# Patient Record
Sex: Male | Born: 1965 | Race: Black or African American | Hispanic: No | Smoking: Never smoker
Health system: Southern US, Community
[De-identification: ages and names within clinical notes are randomized; demographics above are authoritative.]

## PROBLEM LIST (undated history)

## (undated) DIAGNOSIS — M109 Gout, unspecified: Secondary | ICD-10-CM

## (undated) DIAGNOSIS — I10 Essential (primary) hypertension: Secondary | ICD-10-CM

## (undated) HISTORY — PX: APPENDECTOMY: SHX54

## (undated) HISTORY — PX: KNEE ARTHROSCOPY: SUR90

---

## 2017-01-24 ENCOUNTER — Other Ambulatory Visit: Payer: Self-pay

## 2017-01-24 ENCOUNTER — Encounter (HOSPITAL_COMMUNITY): Payer: Self-pay

## 2017-01-24 ENCOUNTER — Observation Stay (HOSPITAL_COMMUNITY): Payer: 59

## 2017-01-24 ENCOUNTER — Inpatient Hospital Stay (HOSPITAL_COMMUNITY)
Admission: EM | Admit: 2017-01-24 | Discharge: 2017-01-31 | DRG: 871 | Disposition: A | Discharge status: Skilled Nursing Facility | Payer: 59 | Attending: Internal Medicine | Admitting: Internal Medicine

## 2017-01-24 DIAGNOSIS — M109 Gout, unspecified: Secondary | ICD-10-CM | POA: Diagnosis present

## 2017-01-24 DIAGNOSIS — D649 Anemia, unspecified: Secondary | ICD-10-CM | POA: Insufficient documentation

## 2017-01-24 DIAGNOSIS — M10062 Idiopathic gout, left knee: Secondary | ICD-10-CM

## 2017-01-24 DIAGNOSIS — K259 Gastric ulcer, unspecified as acute or chronic, without hemorrhage or perforation: Secondary | ICD-10-CM | POA: Diagnosis present

## 2017-01-24 DIAGNOSIS — R509 Fever, unspecified: Secondary | ICD-10-CM

## 2017-01-24 DIAGNOSIS — I10 Essential (primary) hypertension: Secondary | ICD-10-CM

## 2017-01-24 DIAGNOSIS — Z888 Allergy status to other drugs, medicaments and biological substances status: Secondary | ICD-10-CM

## 2017-01-24 DIAGNOSIS — K922 Gastrointestinal hemorrhage, unspecified: Secondary | ICD-10-CM

## 2017-01-24 DIAGNOSIS — M25539 Pain in unspecified wrist: Secondary | ICD-10-CM

## 2017-01-24 DIAGNOSIS — J181 Lobar pneumonia, unspecified organism: Secondary | ICD-10-CM | POA: Diagnosis present

## 2017-01-24 DIAGNOSIS — E876 Hypokalemia: Secondary | ICD-10-CM | POA: Diagnosis not present

## 2017-01-24 DIAGNOSIS — A419 Sepsis, unspecified organism: Principal | ICD-10-CM | POA: Diagnosis present

## 2017-01-24 DIAGNOSIS — R6520 Severe sepsis without septic shock: Secondary | ICD-10-CM | POA: Diagnosis present

## 2017-01-24 DIAGNOSIS — R0600 Dyspnea, unspecified: Secondary | ICD-10-CM

## 2017-01-24 DIAGNOSIS — K2970 Gastritis, unspecified, without bleeding: Secondary | ICD-10-CM | POA: Diagnosis present

## 2017-01-24 DIAGNOSIS — D539 Nutritional anemia, unspecified: Secondary | ICD-10-CM | POA: Diagnosis present

## 2017-01-24 DIAGNOSIS — M25562 Pain in left knee: Secondary | ICD-10-CM | POA: Diagnosis not present

## 2017-01-24 DIAGNOSIS — N179 Acute kidney failure, unspecified: Secondary | ICD-10-CM

## 2017-01-24 HISTORY — DX: Gout, unspecified: M10.9

## 2017-01-24 HISTORY — DX: Essential (primary) hypertension: I10

## 2017-01-24 LAB — COMPREHENSIVE METABOLIC PANEL
ALBUMIN: 3.4 g/dL — AB (ref 3.5–5.0)
ALK PHOS: 101 U/L (ref 38–126)
ALT: 26 U/L (ref 17–63)
ANION GAP: 18 — AB (ref 5–15)
AST: 49 U/L — ABNORMAL HIGH (ref 15–41)
BUN: 18 mg/dL (ref 6–20)
CALCIUM: 7 mg/dL — AB (ref 8.9–10.3)
CHLORIDE: 89 mmol/L — AB (ref 101–111)
CO2: 30 mmol/L (ref 22–32)
Creatinine, Ser: 1.06 mg/dL (ref 0.61–1.24)
GFR calc Af Amer: >60 mL/min (ref >60)
GFR calc non Af Amer: >60 mL/min (ref >60)
GLUCOSE: 167 mg/dL — AB (ref 65–99)
Potassium: 2.4 mmol/L — CL (ref 3.5–5.1)
SODIUM: 137 mmol/L (ref 135–145)
Total Bilirubin: 2 mg/dL — ABNORMAL HIGH (ref 0.3–1.2)
Total Protein: 7.4 g/dL (ref 6.5–8.1)

## 2017-01-24 LAB — BASIC METABOLIC PANEL
ANION GAP: 15 (ref 5–15)
BUN: 16 mg/dL (ref 6–20)
CHLORIDE: 92 mmol/L — AB (ref 101–111)
CO2: 31 mmol/L (ref 22–32)
Calcium: 6.7 mg/dL — ABNORMAL LOW (ref 8.9–10.3)
Creatinine, Ser: 0.92 mg/dL (ref 0.61–1.24)
GFR calc non Af Amer: >60 mL/min (ref >60)
Glucose, Bld: 153 mg/dL — ABNORMAL HIGH (ref 65–99)
Potassium: 3 mmol/L — ABNORMAL LOW (ref 3.5–5.1)
Sodium: 138 mmol/L (ref 135–145)

## 2017-01-24 LAB — SYNOVIAL CELL COUNT + DIFF, W/ CRYSTALS
MONOCYTE-MACROPHAGE-SYNOVIAL FLUID: 3 % — AB (ref 50–90)
NEUTROPHIL, SYNOVIAL: 97 % — AB (ref 0–25)
WBC, Synovial: 61500 /mm3 — ABNORMAL HIGH (ref 0–200)

## 2017-01-24 LAB — CBC WITH DIFFERENTIAL/PLATELET
BASOS PCT: 0 %
Basophils Absolute: 0 10*3/uL (ref 0.0–0.1)
EOS ABS: 0 10*3/uL (ref 0.0–0.7)
EOS PCT: 0 %
HEMATOCRIT: 24.9 % — AB (ref 39.0–52.0)
HEMOGLOBIN: 8.6 g/dL — AB (ref 13.0–17.0)
LYMPHS PCT: 8 %
Lymphs Abs: 1.4 10*3/uL (ref 0.7–4.0)
MCH: 36 pg — AB (ref 26.0–34.0)
MCHC: 34.5 g/dL (ref 30.0–36.0)
MCV: 104.2 fL — AB (ref 78.0–100.0)
Monocytes Absolute: 1.9 10*3/uL — ABNORMAL HIGH (ref 0.1–1.0)
Monocytes Relative: 11 %
NEUTROS ABS: 14.1 10*3/uL — AB (ref 1.7–7.7)
NEUTROS PCT: 81 %
Platelets: 284 10*3/uL (ref 150–400)
RBC: 2.39 MIL/uL — ABNORMAL LOW (ref 4.22–5.81)
RDW: 21.6 % — ABNORMAL HIGH (ref 11.5–15.5)
WBC: 17.4 10*3/uL — ABNORMAL HIGH (ref 4.0–10.5)

## 2017-01-24 LAB — SAMPLE TO BLOOD BANK

## 2017-01-24 LAB — URIC ACID: Uric Acid, Serum: 8.7 mg/dL — ABNORMAL HIGH (ref 4.4–7.6)

## 2017-01-24 LAB — MAGNESIUM
MAGNESIUM: 0.7 mg/dL — AB (ref 1.7–2.4)
Magnesium: 0.8 mg/dL — CL (ref 1.7–2.4)

## 2017-01-24 LAB — SEDIMENTATION RATE: SED RATE: 84 mm/h — AB (ref 0–16)

## 2017-01-24 LAB — CBG MONITORING, ED: GLUCOSE-CAPILLARY: 134 mg/dL — AB (ref 65–99)

## 2017-01-24 MED ORDER — INDOMETHACIN 25 MG PO CAPS
50.0000 mg | ORAL_CAPSULE | Freq: Three times a day (TID) | ORAL | Status: DC
  Filled 2017-01-24: qty 2

## 2017-01-24 MED ORDER — MAGNESIUM SULFATE 2 GM/50ML IV SOLN
2.0000 g | Freq: Once | INTRAVENOUS | Status: AC
  Administered 2017-01-24: 2 g via INTRAVENOUS
  Filled 2017-01-24: qty 50

## 2017-01-24 MED ORDER — COLCHICINE 0.6 MG PO TABS: 0.6000 mg | ORAL_TABLET | Freq: Once | ORAL | Status: DC

## 2017-01-24 MED ORDER — COLCHICINE 0.6 MG PO TABS
1.2000 mg | ORAL_TABLET | Freq: Once | ORAL | Status: AC
  Administered 2017-01-24: 1.2 mg via ORAL

## 2017-01-24 MED ORDER — ONDANSETRON HCL 4 MG/2ML IJ SOLN
4.0000 mg | Freq: Once | INTRAMUSCULAR | Status: AC
  Administered 2017-01-24: 4 mg via INTRAVENOUS
  Filled 2017-01-24: qty 2

## 2017-01-24 MED ORDER — PANTOPRAZOLE SODIUM 40 MG PO TBEC
40.0000 mg | DELAYED_RELEASE_TABLET | Freq: Every day | ORAL | Status: DC
  Administered 2017-01-25 – 2017-01-31 (×7): 40 mg via ORAL
  Filled 2017-01-24 (×7): qty 1

## 2017-01-24 MED ORDER — ACETAMINOPHEN 325 MG PO TABS
650.0000 mg | ORAL_TABLET | Freq: Three times a day (TID) | ORAL | Status: DC | PRN
  Administered 2017-01-24 – 2017-01-25 (×3): 650 mg via ORAL
  Filled 2017-01-24 (×3): qty 2

## 2017-01-24 MED ORDER — FENTANYL CITRATE (PF) 100 MCG/2ML IJ SOLN
100.0000 ug | INTRAMUSCULAR | Status: DC | PRN
  Administered 2017-01-24 (×3): 100 ug via INTRAVENOUS
  Filled 2017-01-24 (×3): qty 2

## 2017-01-24 MED ORDER — ALLOPURINOL 100 MG PO TABS
100.0000 mg | ORAL_TABLET | Freq: Every day | ORAL | Status: DC
  Administered 2017-01-25 – 2017-01-26 (×2): 100 mg via ORAL
  Filled 2017-01-24 (×2): qty 1

## 2017-01-24 MED ORDER — POTASSIUM CHLORIDE IN NACL 40-0.9 MEQ/L-% IV SOLN
INTRAVENOUS | Status: AC
  Administered 2017-01-24: 75 mL/h via INTRAVENOUS
  Filled 2017-01-24: qty 1000

## 2017-01-24 MED ORDER — COLCHICINE 0.6 MG PO TABS
0.6000 mg | ORAL_TABLET | Freq: Every day | ORAL | Status: DC
  Administered 2017-01-25 – 2017-01-26 (×2): 0.6 mg via ORAL
  Filled 2017-01-24 (×2): qty 1

## 2017-01-24 MED ORDER — LEVOFLOXACIN IN D5W 750 MG/150ML IV SOLN
750.0000 mg | INTRAVENOUS | Status: DC
  Administered 2017-01-24: 750 mg via INTRAVENOUS
  Filled 2017-01-24: qty 150

## 2017-01-24 MED ORDER — SODIUM CHLORIDE 0.9 % IV BOLUS (SEPSIS)
500.0000 mL | Freq: Once | INTRAVENOUS | Status: AC
Start: 2017-01-24 — End: 2017-01-24
  Administered 2017-01-24: 500 mL via INTRAVENOUS

## 2017-01-24 MED ORDER — POTASSIUM CHLORIDE 10 MEQ/100ML IV SOLN
10.0000 meq | INTRAVENOUS | Status: AC
Start: 2017-01-24 — End: 2017-01-24
  Administered 2017-01-24 (×3): 10 meq via INTRAVENOUS
  Filled 2017-01-24 (×3): qty 100

## 2017-01-24 MED ORDER — COLCHICINE 0.6 MG PO TABS
0.6000 mg | ORAL_TABLET | Freq: Once | ORAL | Status: DC
  Filled 2017-01-24: qty 1

## 2017-01-24 MED ORDER — HYDRALAZINE HCL 50 MG PO TABS
50.0000 mg | ORAL_TABLET | Freq: Three times a day (TID) | ORAL | Status: DC
  Administered 2017-01-24 – 2017-01-29 (×15): 50 mg via ORAL
  Filled 2017-01-24 (×15): qty 1

## 2017-01-24 MED ORDER — AMLODIPINE BESYLATE 10 MG PO TABS
10.0000 mg | ORAL_TABLET | Freq: Every day | ORAL | Status: DC
  Administered 2017-01-25 – 2017-01-31 (×7): 10 mg via ORAL
  Filled 2017-01-24 (×7): qty 1

## 2017-01-24 MED ORDER — OXYCODONE HCL 5 MG PO TABS
5.0000 mg | ORAL_TABLET | ORAL | Status: DC | PRN
  Administered 2017-01-25 (×3): 5 mg via ORAL
  Filled 2017-01-24 (×3): qty 1

## 2017-01-24 MED ORDER — FENTANYL CITRATE (PF) 100 MCG/2ML IJ SOLN
100.0000 ug | INTRAMUSCULAR | Status: DC | PRN
  Administered 2017-01-24 – 2017-01-25 (×6): 100 ug via INTRAVENOUS
  Filled 2017-01-24 (×6): qty 2

## 2017-01-24 MED ORDER — POTASSIUM CHLORIDE CRYS ER 20 MEQ PO TBCR
40.0000 meq | EXTENDED_RELEASE_TABLET | Freq: Once | ORAL | Status: AC
  Administered 2017-01-24: 40 meq via ORAL
  Filled 2017-01-24: qty 2

## 2017-01-24 NOTE — ED Notes (Signed)
Bed: XB14WA25 Expected date:  Expected time:  Means of arrival:  Comments: EMS-gout flare up

## 2017-01-24 NOTE — ED Notes (Signed)
ED Provider at bedside. 

## 2017-01-24 NOTE — Progress Notes (Signed)
Pharmacy Antibiotic Note  Scott [name] is a 51 y.o. male admitted on 01/24/2017 with fever, possible PNA.  Pharmacy has been consulted for levaquin dosing.  WBC 17.4, Cr 0.94, Temp 100.3. Wt 101.2 kg. CXR: LL PNA   Plan: levaquin 750 mg IV q24 - change to PO when able Pharmacy to sign off  Height: 5\' 11"  (180.3 cm) Weight: 223 lb (101.2 kg) IBW/kg (Calculated) : 75.3  Temp (24hrs), Avg:100.1 F (37.8 C), Min:99.2 F (37.3 C), Max:101.3 F (38.5 C)  Recent Labs  Lab 01/24/17 0855 01/24/17 1425  WBC 17.4*  --   CREATININE 1.06 0.92    Estimated Creatinine Clearance: 117.7 mL/min (by C-G formula based on SCr of 0.92 mg/dL).    Allergies  Allergen Reactions  . Ace Inhibitors Swelling    Facial swelling    Thank you for allowing pharmacy to be a part of this patient's care. Herby AbrahamMichelle T. Gevorg Brum, Pharm.D. [phone] 01/24/2017 5:04 PM

## 2017-01-24 NOTE — ED Provider Notes (Signed)
[facility] Provider Note   CSN: [id number] Arrival date & time: 01/24/17  0803     History   Chief Complaint Chief Complaint  Patient presents with  . Gout    HPI Scott [name] is a 51 y.o. male.  He complains of pain in his legs and right wrist, beginning 2 days ago without trauma.  Also had similar pain last week, at which time he was hospitalized, for upper abdominal pain, and apparently received blood transfusions.  No prior history of blood transfusion.  He was told that his "stomach was inflamed,", and that it was related to his gout medication.  No recent fever, chills, vomiting, weakness or dizziness.  He denies headache or back pain.  He has a sensation of tingling in both feet, which is been going on for about 2 weeks. He was started on Allopurinol 2 days ago.  was started there are no other known modifying factors.  HPI  Past Medical History:  Diagnosis Date  . Gout   . Hypertension     Patient Active Problem List   Diagnosis Date Noted  . Hypokalemia 01/24/2017  . Gout attack 01/24/2017  . Gastritis 01/24/2017  . h/o recent GIB (gastrointestinal bleeding) 01/24/2017  . Hypomagnesemia 01/24/2017  . Anemia   . Hypertension     History reviewed. No pertinent surgical history.     Home Medications    Prior to Admission medications   Medication Sig Start Date End Date Taking? Authorizing Provider  acetaminophen (TYLENOL) 500 MG tablet Take 500 mg by mouth every 6 (six) hours as needed for mild pain.   Yes [provider]  allopurinol (ZYLOPRIM) 100 MG tablet Take 100 mg by mouth daily.   Yes [provider]  amLODipine (NORVASC) 10 MG tablet Take 10 mg by mouth daily.   Yes [provider]  furosemide (LASIX) 20 MG tablet Take 20 mg by mouth daily as needed for edema.   Yes [provider]  hydrALAZINE (APRESOLINE) 50 MG tablet Take 50 mg by mouth 3 (three) times daily.   Yes  [provider]  indomethacin (INDOCIN) 50 MG capsule Take 50 mg by mouth 3 (three) times daily with meals.   Yes [provider]  Multiple Vitamin (MULTIVITAMIN WITH MINERALS) TABS tablet Take 1 tablet by mouth daily.   Yes [provider]  omeprazole (PRILOSEC) 20 MG capsule Take 20 mg by mouth 2 (two) times daily before a meal.   Yes [provider]  predniSONE (DELTASONE) 10 MG tablet Take 30 mg by mouth daily with breakfast.   Yes [provider]    Family History History reviewed. No pertinent family history.  Social History Social History   Tobacco Use  . Smoking status: Never Smoker  . Smokeless tobacco: Never Used  Substance Use Topics  . Alcohol use: No    Frequency: Never  . Drug use: No     Allergies   Ace inhibitors   Review of Systems Review of Systems  All other systems reviewed and are negative.    Physical Exam Updated Vital Signs BP (!) 152/91 (BP Location: Left Arm)   Pulse (!) 115   Temp 100.3 F (37.9 C) (Oral)   Resp 18   Ht 5\' 11"  (1.803 m)   Wt 101.2 kg (223 lb)   SpO2 99%   BMI 31.10 kg/m   Physical Exam  Constitutional: He is oriented to person, place, and time. He appears  well-developed and well-nourished. No distress.  HENT:  Head: Normocephalic and atraumatic.  Right Ear: External ear normal.  Left Ear: External ear normal.  Eyes: Conjunctivae and EOM are normal. Pupils are equal, round, and reactive to light.  Neck: Normal range of motion and phonation normal. Neck supple.  Cardiovascular: Normal rate, regular rhythm and normal heart sounds.  Pulmonary/Chest: Effort normal and breath sounds normal. He exhibits no bony tenderness.  Abdominal: Soft. There is no tenderness.  Musculoskeletal: Normal range of motion.  Large left knee joint effusion.  Moderate swelling left ankle.  Mild swelling right wrist, radiocarpal region. Bilateral LE edema L > R.  Neurological: He is alert and  oriented to person, place, and time. No cranial nerve deficit or sensory deficit. He exhibits normal muscle tone. Coordination normal.  Skin: Skin is warm, dry and intact.  Psychiatric: He has a normal mood and affect. His behavior is normal. Judgment and thought content normal.  Nursing note and vitals reviewed.    ED Treatments / Results  Labs (all labs ordered are listed, but only abnormal results are displayed) Labs Reviewed  COMPREHENSIVE METABOLIC PANEL - Abnormal; Notable for the following components:      Result Value   Potassium 2.4 (*)    Chloride 89 (*)    Glucose, Bld 167 (*)    Calcium 7.0 (*)    Albumin 3.4 (*)    AST 49 (*)    Total Bilirubin 2.0 (*)    Anion gap 18 (*)    All other components within normal limits  CBC WITH DIFFERENTIAL/PLATELET - Abnormal; Notable for the following components:   WBC 17.4 (*)    RBC 2.39 (*)    Hemoglobin 8.6 (*)    HCT 24.9 (*)    MCV 104.2 (*)    MCH 36.0 (*)    RDW 21.6 (*)    Neutro Abs 14.1 (*)    Monocytes Absolute 1.9 (*)    All other components within normal limits  URIC ACID - Abnormal; Notable for the following components:   Uric Acid, Serum 8.7 (*)    All other components within normal limits  SEDIMENTATION RATE - Abnormal; Notable for the following components:   Sed Rate 84 (*)    All other components within normal limits  MAGNESIUM - Abnormal; Notable for the following components:   Magnesium 0.7 (*)    All other components within normal limits  SYNOVIAL CELL COUNT + DIFF, W/ CRYSTALS - Abnormal; Notable for the following components:   Appearance-Synovial TURBID (*)    WBC, Synovial 61,500 (*)    Neutrophil, Synovial 97 (*)    Monocyte-Macrophage-Synovial Fluid 3 (*)    All other components within normal limits  BASIC METABOLIC PANEL - Abnormal; Notable for the following components:   Potassium 3.0 (*)    Chloride 92 (*)    Glucose, Bld 153 (*)    Calcium 6.7 (*)    All other components within normal  limits  MAGNESIUM - Abnormal; Notable for the following components:   Magnesium 0.8 (*)    All other components within normal limits  CBG MONITORING, ED - Abnormal; Notable for the following components:   Glucose-Capillary 134 (*)    All other components within normal limits  BODY FLUID CULTURE  CULTURE, BLOOD (ROUTINE X 2)  CULTURE, BLOOD (ROUTINE X 2)  GLUCOSE, BODY FLUID OTHER  URINALYSIS, ROUTINE W REFLEX MICROSCOPIC  BASIC METABOLIC PANEL  CBC  SAMPLE TO BLOOD BANK  EKG  EKG Interpretation None       Radiology Dg Chest 2 View  Result Date: 01/24/2017 CLINICAL DATA:  High fever. EXAM: CHEST  2 VIEW COMPARISON:  None. FINDINGS: Heart size is normal. Mediastinal shadows are normal. Abnormal density on the lateral view overlying the lower spine is consistent with lower lobe pneumonia. Side indeterminate based on the frontal view. No effusions. No acute bone finding. IMPRESSION: Lower lobe pneumonia visible on the lateral view, side indeterminate. Electronically Signed   By: Paulina Fusi M.D.   On: 01/24/2017 15:54    Procedures .Joint Aspiration/Arthrocentesis Date/Time: 01/24/2017 12:07 PM Performed by: Mancel Bale, MD Authorized by: Mancel Bale, MD   Consent:    Consent obtained:  Verbal   Consent given by:  Patient   Risks discussed:  Bleeding, infection and incomplete drainage   Alternatives discussed:  No treatment Location:    Location:  Knee   Knee:  L knee Anesthesia (see MAR for exact dosages):    Anesthesia method:  None Procedure details:    Preparation: Patient was prepped and draped in usual sterile fashion     Needle gauge:  18 G   Ultrasound guidance: no     Approach:  Medial   Aspirate characteristics:  Yellow (opaque, thick)   Steroid injected: no     Specimen collected: yes   Post-procedure details:    Dressing:  Adhesive bandage   Patient tolerance of procedure:  Tolerated well, no immediate complications      (including critical care time)  Medications Ordered in ED Medications  amLODipine (NORVASC) tablet 10 mg (not administered)  pantoprazole (PROTONIX) EC tablet 40 mg (not administered)  hydrALAZINE (APRESOLINE) tablet 50 mg (50 mg Oral Given 01/24/17 1621)  allopurinol (ZYLOPRIM) tablet 100 mg (not administered)  0.9 % NaCl with KCl 40 mEq / L  infusion (75 mL/hr Intravenous New Bag/Given 01/24/17 1407)  colchicine tablet 1.2 mg (1.2 mg Oral Given 01/24/17 1621)    Followed by  colchicine tablet 0.6 mg (not administered)    Followed by  colchicine tablet 0.6 mg (not administered)  fentaNYL (SUBLIMAZE) injection 100 mcg (100 mcg Intravenous Given 01/24/17 1438)  acetaminophen (TYLENOL) tablet 650 mg (650 mg Oral Given 01/24/17 1515)  levofloxacin (LEVAQUIN) IVPB 750 mg (750 mg Intravenous New Bag/Given 01/24/17 1746)  ondansetron (ZOFRAN) injection 4 mg (4 mg Intravenous Given 01/24/17 0852)  sodium chloride 0.9 % bolus 500 mL (0 mLs Intravenous Stopped 01/24/17 0943)  potassium chloride SA (K-DUR,KLOR-CON) CR tablet 40 mEq (40 mEq Oral Given 01/24/17 1052)  potassium chloride 10 mEq in 100 mL IVPB (10 mEq Intravenous New Bag/Given 01/24/17 1245)  magnesium sulfate IVPB 2 g 50 mL (0 g Intravenous Stopped 01/24/17 1801)     Initial Impression / Assessment and Plan / ED Course  I have reviewed the triage vital signs and the nursing notes.  Pertinent labs & imaging results that were available during my care of the patient were reviewed by me and considered in my medical decision making (see chart for details).  Clinical Course as of Jan 24 1837  Wed Jan 24, 2017  1049 Record review: He saw his primary care doctor 2 days ago to follow-up on hospitalization, was discharged with hemoglobin 9.6, had Doppler imaging legs to evaluate for DVT.  During the hospitalization he was started on Lasix for peripheral edema, without potassium supplementation.  Patient had long-standing history of alcohol  abuse, leading to suspected gastritis, which cause bleeding.  He  was recently started on allopurinol, to treat gout.  [EW]  1050 Elevated, consistent with gout Uric Acid, Serum: (!) 8.7 [EW]  1050 Low following initiation of diuretic treatment Potassium: (!!) 2.4 [EW]  1050 Low Chloride: (!) 89 [EW]  1050 High Glucose: (!) 167 [EW]  1050 High, nonspecific WBC: (!) 17.4 [EW]  1050 Low, decreased from recent baseline Hemoglobin: (!) 8.6 [EW]  1051 Elevated MCV: (!) 104.2 [EW]  1051 High Pulse Rate: (!) 106 [EW]  1051 High BP: (!) 157/101 [EW]  1110 Elevated Sed Rate: (!) 84 [EW]    Clinical Course User Index [EW] Mancel BaleWentz, Emmabelle Fear, MD     Patient Vitals for the past 24 hrs:  BP Temp Temp src Pulse Resp SpO2 Height Weight  01/24/17 1621 (!) 152/91 100.3 F (37.9 C) Oral (!) 115 18 99 % - -  01/24/17 1321 (!) 148/99 (!) 101.3 F (38.5 C) Oral (!) 112 18 99 % 5\' 11"  (1.803 m) 101.2 kg (223 lb)  01/24/17 1247 (!) 156/99 99.2 F (37.3 C) Oral (!) 113 16 97 % - -  01/24/17 1141 (!) 156/101 - - (!) 112 18 98 % - -  01/24/17 1100 - - - - 18 - - -  01/24/17 1030 (!) 155/99 - - (!) 109 18 96 % - -  01/24/17 0945 (!) 157/101 - - (!) 106 20 97 % - -  01/24/17 0810 (!) 166/105 99.4 F (37.4 C) Oral (!) 111 18 98 % - -  01/24/17 0805 - - - - - 99 % - -    11:08 AM Reevaluation with update and discussion. After initial assessment and treatment, an updated evaluation reveals he is more comfortable at this time.  Findings discussed with patient, all questions answered. Mancel BaleElliott Elfego Giammarino   11:09 AM-Consult complete with Hospitalist. Patient case explained and discussed. She prefers knee aspiration prior to admission to evaluate for knee infection, after that, agrees to admit patient for further evaluation and treatment. Call ended at 11:20 PM   Final Clinical Impressions(s) / ED Diagnoses   Final diagnoses:  Hypokalemia  Anemia, unspecified type  Acute idiopathic gout of left knee  Hypertension,  unspecified type   Acute gout left knee.  Incidental anemia, nonspecific.  Hypertension, persistent.  Hypokalemia, nonspecific.  Nursing Notes Reviewed/ Care Coordinated Applicable Imaging Reviewed Interpretation of Laboratory Data incorporated into ED treatment  Plan: Admit   ED Discharge Orders    None       Mancel BaleWentz, Hennie Gosa, MD 01/24/17 Paulo [id number]

## 2017-01-24 NOTE — H&P (Signed)
History and Physical    Scott EvenerKurk [name] ZOX:[id number] DOB: 11/05/1965 DOA: 01/24/2017  PCP: Patient, No Pcp Per  Patient coming from:  home  Chief Complaint:   Knee pain  HPI: Scott [name] is a 51 y.o. male with medical history significant of recent hospitalization at [facility] with gib due to gastritis requiring blood transfusions from nsaid use for gout comes in with left knee and ankle pain c/w his gout.  He was started on allopurinol 2 days ago and not colchicine.  He was recentling started on lasix due to le swelling from when he was anemic.  He denies chf.  His hgb is 8/6 was 9.6 at his discharge.  He denies any fevers. No chills.  No n/v/d.  Weekly etoh use.  Denies any bleeding.  Pt found to have k level of 2.4 and referred for admission for repletion.  Review of Systems: As per HPI otherwise 10 point review of systems negative.   Past Medical History:  Diagnosis Date  . Gout   . Hypertension     pmhx as above in hpi    has no tobacco, alcohol, and drug history on file. regular etoh use.  O/w neg  Allergies  Allergen Reactions  . Ace Inhibitors Swelling    Facial swelling    No family history on file.  No premature CAD  Prior to Admission medications   Medication Sig Start Date End Date Taking? Authorizing Provider  allopurinol (ZYLOPRIM) 100 MG tablet Take 100 mg by mouth daily.   Yes [provider]  amLODipine (NORVASC) 10 MG tablet Take 10 mg by mouth daily.   Yes [provider]  furosemide (LASIX) 20 MG tablet Take 20 mg by mouth daily as needed for edema.   Yes [provider]  hydrALAZINE (APRESOLINE) 50 MG tablet Take 50 mg by mouth 3 (three) times daily.   Yes [provider]  indomethacin (INDOCIN) 50 MG capsule Take 50 mg by mouth 3 (three) times daily with meals.   Yes [provider]  omeprazole (PRILOSEC) 20 MG capsule Take 20 mg by mouth 2 (two) times daily before a meal.   Yes [provider]     Physical Exam: Vitals:   01/24/17 0945 01/24/17 1030 01/24/17 1100 01/24/17 1141  BP: (!) 157/101 (!) 155/99  (!) 156/101  Pulse: (!) 106 (!) 109  (!) 112  Resp: 20 18 18 18   Temp:      TempSrc:      SpO2: 97% 96%  98%      Constitutional: NAD, calm, comfortable Vitals:   01/24/17 0945 01/24/17 1030 01/24/17 1100 01/24/17 1141  BP: (!) 157/101 (!) 155/99  (!) 156/101  Pulse: (!) 106 (!) 109  (!) 112  Resp: 20 18 18 18   Temp:      TempSrc:      SpO2: 97% 96%  98%   Eyes: PERRL, lids and conjunctivae normal ENMT: Mucous membranes are moist. Posterior pharynx clear of any exudate or lesions.Normal dentition.  Neck: normal, supple, no masses, no thyromegaly Respiratory: clear to auscultation bilaterally, no wheezing, no crackles. Normal respiratory effort. No accessory muscle use.  Cardiovascular: Regular rate and rhythm, no murmurs / rubs / gallops. No extremity edema. 2+ pedal pulses. No carotid bruits.  Abdomen: no tenderness, no masses palpated. No hepatosplenomegaly. Bowel sounds positive.  Musculoskeletal: no clubbing / cyanosis. No joint deformity upper and lower extremities x left knee swollen not red. Good ROM, no contractures. Normal muscle  tone.  Skin: no rashes, lesions, ulcers. No induration Neurologic: CN 2-12 grossly intact. Sensation intact, DTR normal. Strength 5/5 in all 4.  Psychiatric: Normal judgment and insight. Alert and oriented x 3. Normal mood.    Labs on Admission: I have personally reviewed following labs and imaging studies  CBC: Recent Labs  Lab 01/24/17 0855  WBC 17.4*  NEUTROABS 14.1*  HGB 8.6*  HCT 24.9*  MCV 104.2*  PLT 284   Basic Metabolic Panel: Recent Labs  Lab 01/24/17 0855 01/24/17 1124  NA 137  --   K 2.4*  --   CL 89*  --   CO2 30  --   GLUCOSE 167*  --   BUN 18  --   CREATININE 1.06  --   CALCIUM 7.0*  --   MG  --  0.7*   GFR: CrCl cannot be calculated (Unknown ideal weight.). Liver Function Tests: Recent  Labs  Lab 01/24/17 0855  AST 49*  ALT 26  ALKPHOS 101  BILITOT 2.0*  PROT 7.4  ALBUMIN 3.4*   No results for input(s): LIPASE, AMYLASE in the last 168 hours. No results for input(s): AMMONIA in the last 168 hours. Coagulation Profile: No results for input(s): INR, PROTIME in the last 168 hours. Cardiac Enzymes: No results for input(s): CKTOTAL, CKMB, CKMBINDEX, TROPONINI in the last 168 hours. BNP (last 3 results) No results for input(s): PROBNP in the last 8760 hours. HbA1C: No results for input(s): HGBA1C in the last 72 hours. CBG: No results for input(s): GLUCAP in the last 168 hours. Lipid Profile: No results for input(s): CHOL, HDL, LDLCALC, TRIG, CHOLHDL, LDLDIRECT in the last 72 hours. Thyroid Function Tests: No results for input(s): TSH, T4TOTAL, FREET4, T3FREE, THYROIDAB in the last 72 hours. Anemia Panel: No results for input(s): VITAMINB12, FOLATE, FERRITIN, TIBC, IRON, RETICCTPCT in the last 72 hours. Urine analysis: No results found for: COLORURINE, APPEARANCEUR, LABSPEC, PHURINE, GLUCOSEU, HGBUR, BILIRUBINUR, KETONESUR, PROTEINUR, UROBILINOGEN, NITRITE, LEUKOCYTESUR Sepsis Labs: !!!!!!!!!!!!!!!!!!!!!!!!!!!!!!!!!!!!!!!!!!!! @LABRCNTIP (procalcitonin:4,lacticidven:4) )No results found for this or any previous visit (from the past 240 hour(s)).   Radiological Exams on Admission: No results found.  Old chart reviewed Case discussed with edp dr Effie Shywentz  Assessment/Plan 51 yo male with recent dx of gastritis/gib comes in with gout flare and electrolyte abnormalites  Principal Problem:   Hypokalemia- likely due to recent lasix use.  Replete iv kcl 30mg  now and 40 po now,.  Mag also low at 0.7.  Replete mag.  Repeat bmp and mag at 3pm and further replete if needed.  Tele.  Active Problems:   Hypomagnesemia- mag sulfate 2 gm iv once and repeat level at 3 then reassess   Gout attack- knee tapped by dr Effie Shywentz, f/u on fluid.  Likely gout but with elevated wbc assess  other causes.  Start colchicine   Gastritis- cont protonix   h/o recent GIB (gastrointestinal bleeding)- noted   Obtain old records   DVT prophylaxis:  scds Code Status:  full Family Communication: wife Disposition Plan:  Per day team Consults called:  none Admission status:  observation   Krystiana Fornes A MD [facility]  If 7PM-7AM, please contact night-coverage [email or web address] Password [email or web address]  01/24/2017, 12:36 PM

## 2017-01-24 NOTE — Progress Notes (Signed)
Pt c/o 10 out of 10 knee pain not relieved by current pain regimen of 100 mcg IV fentanyl q2h. On call Craige CottaKirby made aware.

## 2017-01-24 NOTE — ED Triage Notes (Signed)
Transported by [facility] from home. Pt reports severe tenderness and swelling of LLE r/t to gout flare up. Seen for the same complaint last week. Pain has gotten worse, and pt reports no relief with prescribed medications.

## 2017-01-24 NOTE — Progress Notes (Signed)
Pt's HR sustaining 115-120s, [facility]. Awaiting orders.

## 2017-01-24 NOTE — ED Notes (Signed)
RN informed of abnormal lab 

## 2017-01-24 NOTE — Progress Notes (Signed)
MEDICATION RELATED CONSULT NOTE - INITIAL   Pharmacy Consult for Colchicine Indication: Gout Flare  Allergies  Allergen Reactions  . Ace Inhibitors Swelling    Facial swelling    Patient Measurements: Height: 5\' 11"  (180.3 cm) Weight: 223 lb (101.2 kg) IBW/kg (Calculated) : 75.3  Vital Signs: Temp: 101.3 F (38.5 C) (12/26 1321) Temp Source: Oral (12/26 1321) BP: 148/99 (12/26 1321) Pulse Rate: 112 (12/26 1321)  Labs: Recent Labs    01/24/17 0855 01/24/17 1124  WBC 17.4*  --   HGB 8.6*  --   HCT 24.9*  --   PLT 284  --   CREATININE 1.06  --   MG  --  0.7*  ALBUMIN 3.4*  --   PROT 7.4  --   AST 49*  --   ALT 26  --   ALKPHOS 101  --   BILITOT 2.0*  --    Estimated Creatinine Clearance: 102.2 mL/min (by C-G formula based on SCr of 1.06 mg/dL).   Medical History: Past Medical History:  Diagnosis Date  . Gout   . Hypertension     Assessment: [age] [facility] with acute gout flare, unrelieved by home medications (allopurinol, indomethacin).  Pharmacy is consulted to dose Colchicine. SCr 1.06, CrCl > 100 ml/min Uric acid 8.7  Plan:   Day 1: Colchicine 1.2 mg PO x1 dose now, then 0.6 mg PO 1 hour later.  Day 2 and thereafter: Colchicine 0.6 mg PO once daily until flare resolves   Pharmacy will sign off at this time.  Please reconsult if a change in clinical status warrants re-evaluation of dosage.  MD to monitor clinically and d/c after the flare is resolved.  Lynann Beaverhristine Blessin Kanno PharmD, BCPS Pager [phone] 01/24/2017 1:39 PM

## 2017-01-25 ENCOUNTER — Observation Stay (HOSPITAL_COMMUNITY): Payer: 59

## 2017-01-25 DIAGNOSIS — M25562 Pain in left knee: Secondary | ICD-10-CM

## 2017-01-25 DIAGNOSIS — M10062 Idiopathic gout, left knee: Secondary | ICD-10-CM | POA: Diagnosis not present

## 2017-01-25 DIAGNOSIS — I10 Essential (primary) hypertension: Secondary | ICD-10-CM | POA: Diagnosis not present

## 2017-01-25 DIAGNOSIS — M109 Gout, unspecified: Secondary | ICD-10-CM | POA: Diagnosis not present

## 2017-01-25 DIAGNOSIS — M009 Pyogenic arthritis, unspecified: Secondary | ICD-10-CM

## 2017-01-25 DIAGNOSIS — D649 Anemia, unspecified: Secondary | ICD-10-CM | POA: Diagnosis not present

## 2017-01-25 DIAGNOSIS — E876 Hypokalemia: Secondary | ICD-10-CM | POA: Diagnosis not present

## 2017-01-25 DIAGNOSIS — K2970 Gastritis, unspecified, without bleeding: Secondary | ICD-10-CM | POA: Diagnosis not present

## 2017-01-25 LAB — URINALYSIS, ROUTINE W REFLEX MICROSCOPIC
Bilirubin Urine: NEGATIVE
GLUCOSE, UA: NEGATIVE mg/dL
Hgb urine dipstick: NEGATIVE
KETONES UR: NEGATIVE mg/dL
Leukocytes, UA: NEGATIVE
Nitrite: NEGATIVE
PROTEIN: 100 mg/dL — AB
SQUAMOUS EPITHELIAL / LPF: NONE SEEN
Specific Gravity, Urine: 1.019 (ref 1.005–1.030)
pH: 5 (ref 5.0–8.0)

## 2017-01-25 LAB — BASIC METABOLIC PANEL
ANION GAP: 13 (ref 5–15)
BUN: 21 mg/dL — ABNORMAL HIGH (ref 6–20)
CALCIUM: 6.5 mg/dL — AB (ref 8.9–10.3)
CO2: 28 mmol/L (ref 22–32)
Chloride: 92 mmol/L — ABNORMAL LOW (ref 101–111)
Creatinine, Ser: 1.15 mg/dL (ref 0.61–1.24)
GLUCOSE: 130 mg/dL — AB (ref 65–99)
Potassium: 3.3 mmol/L — ABNORMAL LOW (ref 3.5–5.1)
Sodium: 133 mmol/L — ABNORMAL LOW (ref 135–145)

## 2017-01-25 LAB — CBC
HCT: 24 % — ABNORMAL LOW (ref 39.0–52.0)
HEMOGLOBIN: 8.1 g/dL — AB (ref 13.0–17.0)
MCH: 35.2 pg — ABNORMAL HIGH (ref 26.0–34.0)
MCHC: 33.8 g/dL (ref 30.0–36.0)
MCV: 104.3 fL — ABNORMAL HIGH (ref 78.0–100.0)
Platelets: 254 10*3/uL (ref 150–400)
RBC: 2.3 MIL/uL — ABNORMAL LOW (ref 4.22–5.81)
RDW: 21.3 % — AB (ref 11.5–15.5)
WBC: 16.3 10*3/uL — ABNORMAL HIGH (ref 4.0–10.5)

## 2017-01-25 LAB — STREP PNEUMONIAE URINARY ANTIGEN: Strep Pneumo Urinary Antigen: NEGATIVE

## 2017-01-25 MED ORDER — TRIAMCINOLONE ACETONIDE 40 MG/ML IJ SUSP
80.0000 mg | Freq: Once | INTRAMUSCULAR | Status: AC
  Administered 2017-01-25: 80 mg via INTRA_ARTICULAR
  Filled 2017-01-25: qty 2

## 2017-01-25 MED ORDER — ACETAMINOPHEN 325 MG PO TABS
650.0000 mg | ORAL_TABLET | ORAL | Status: DC | PRN
  Administered 2017-01-25: 650 mg via ORAL
  Filled 2017-01-25: qty 2

## 2017-01-25 MED ORDER — OXYCODONE HCL 5 MG PO TABS
5.0000 mg | ORAL_TABLET | ORAL | Status: DC | PRN
  Administered 2017-01-25: 5 mg via ORAL
  Administered 2017-01-25 – 2017-01-31 (×23): 10 mg via ORAL
  Filled 2017-01-25 (×24): qty 2

## 2017-01-25 MED ORDER — POTASSIUM CHLORIDE CRYS ER 20 MEQ PO TBCR
60.0000 meq | EXTENDED_RELEASE_TABLET | Freq: Once | ORAL | Status: AC
  Administered 2017-01-25: 60 meq via ORAL
  Filled 2017-01-25: qty 3

## 2017-01-25 MED ORDER — DEXTROSE 5 % IV SOLN
2.0000 g | INTRAVENOUS | Status: DC
  Administered 2017-01-25: 2 g via INTRAVENOUS
  Filled 2017-01-25 (×2): qty 2

## 2017-01-25 MED ORDER — POTASSIUM CHLORIDE IN NACL 40-0.9 MEQ/L-% IV SOLN
INTRAVENOUS | Status: DC
  Administered 2017-01-25: 125 mL/h via INTRAVENOUS
  Administered 2017-01-25: 75 mL/h via INTRAVENOUS
  Filled 2017-01-25 (×2): qty 1000

## 2017-01-25 MED ORDER — VANCOMYCIN HCL 10 G IV SOLR: 1250.0000 mg | Freq: Three times a day (TID) | INTRAVENOUS | Status: DC

## 2017-01-25 MED ORDER — VANCOMYCIN HCL IN DEXTROSE 1-5 GM/200ML-% IV SOLN
1000.0000 mg | Freq: Two times a day (BID) | INTRAVENOUS | Status: DC
  Administered 2017-01-25: 1000 mg via INTRAVENOUS
  Filled 2017-01-25: qty 200

## 2017-01-25 MED ORDER — VANCOMYCIN HCL 10 G IV SOLR
2000.0000 mg | Freq: Once | INTRAVENOUS | Status: AC
  Administered 2017-01-25: 2000 mg via INTRAVENOUS
  Filled 2017-01-25: qty 2000

## 2017-01-25 MED ORDER — PREDNISONE 50 MG PO TABS
60.0000 mg | ORAL_TABLET | Freq: Every day | ORAL | Status: DC
  Administered 2017-01-25 – 2017-01-31 (×7): 60 mg via ORAL
  Filled 2017-01-25 (×7): qty 1

## 2017-01-25 MED ORDER — LIDOCAINE HCL 1 % IJ SOLN
10.0000 mL | Freq: Once | INTRAMUSCULAR | Status: AC
  Administered 2017-01-25: 10 mL
  Filled 2017-01-25: qty 20

## 2017-01-25 NOTE — Progress Notes (Signed)
Pharmacy Antibiotic Note  Scott EvenerKurk [name] is a 51 y.o. male admitted on 01/24/2017 with knee pain, hx of gout.  Pharmacy has been consulted for Vancomycin and Rocephin dosing for possible septic arthritis.  Plan: Vancomycin 1250mg   IV every 8 hours.  Goal trough 10-15 mcg/mL.  Rocephin 2gm IV q24 Daily SCr Monitor renal fx closely Follow up blood and synovial fluid cx  Height: 5\' 11"  (180.3 cm) Weight: 223 lb (101.2 kg) IBW/kg (Calculated) : 75.3  Temp (24hrs), Avg:100.1 F (37.8 C), Min:98.4 F (36.9 C), Max:101.3 F (38.5 C)  Recent Labs  Lab 01/24/17 0855 01/24/17 1425 01/25/17 0353  WBC 17.4*  --  16.3*  CREATININE 1.06 0.92 1.15    Estimated Creatinine Clearance: 94.2 mL/min (by C-G formula based on SCr of 1.15 mg/dL).    Allergies  Allergen Reactions  . Ace Inhibitors Swelling    Facial swelling   Antimicrobials this admission: 12/26 Levaquin >> 1227 12/27 Rocephin >>  12/27 Vancomycin >>   Dose adjustments this admission:  Microbiology results: 12/26 BCx: sent 12/26 Synovial cx: abundant WBC, no organism on gram stain  Thank you for allowing pharmacy to be a part of this patient's care.  Otho BellowsGreen, Samirah Scarpati L PharmD Pager [phone] 01/25/2017, 9:35 AM

## 2017-01-25 NOTE — Procedures (Signed)
Procedure: Left knee aspiration and injection  Indication: Left knee effusion(s)  Surgeon: Charma IgoMichael Yazleen Molock, PA-C  Assist: None  Anesthesia: None  EBL: None  Complications: None  Findings: After risks/benefits explained patient desires to undergo procedure. Consent obtained and time out performed. The left knee was sterilely prepped and aspirated. 60+ml turbid yellow fluid aspirated, no e/o purulence. 6ml 1% plain lidocaine and 40mg  depo-medrol instilled. Pt tolerated the procedure well.    Freeman CaldronMichael J. Clearence Vitug, PA-C Orthopedic Surgery [phone]

## 2017-01-25 NOTE — Progress Notes (Addendum)
Pharmacy Antibiotic Note  Scott [name] is a 51 y.o. male admitted on 01/24/2017 with knee pain, hx of gout.  Pharmacy has been consulted for Vancomycin and Rocephin dosing for possible septic arthritis.   Plan: Vancomycin 2 gram IV loading dose, then 1 gm IV q12h, goal AUC 400-550 Rocephin 2gm IV q24 Daily SCr Monitor renal fx closely Follow up blood and synovial fluid cx  Height: 5\' 11"  (180.3 cm) Weight: 223 lb (101.2 kg) IBW/kg (Calculated) : 75.3  Temp (24hrs), Avg:100.1 F (37.8 C), Min:98.4 F (36.9 C), Max:101.3 F (38.5 C)  Recent Labs  Lab 01/24/17 0855 01/24/17 1425 01/25/17 0353  WBC 17.4*  --  16.3*  CREATININE 1.06 0.92 1.15    Estimated Creatinine Clearance: 94.2 mL/min (by C-G formula based on SCr of 1.15 mg/dL).    Allergies  Allergen Reactions  . Ace Inhibitors Swelling    Facial swelling   Antimicrobials this admission: 12/26 Levaquin >> 12/27 12/27 Rocephin >>  12/27 Vancomycin >>   Dose adjustments this admission:  Microbiology results: 12/26 BCx: collected 12/26 Synovial cx: abundant WBC, no organism on gram stain  Thank you for allowing pharmacy to be a part of this patient's care.  Elie Goodyandy Shannon Kirkendall, PharmD, BCPS Pager: [phone] 01/25/2017  11:13 AM

## 2017-01-25 NOTE — Progress Notes (Addendum)
Patient ID: Scott [name], male   DOB: 03/17/1965, 51 y.o.   MRN: [id number]  PROGRESS NOTE    Scott [name]  [id number] DOB: 01/09/1966 DOA: 01/24/2017 PCP: Patient, No Pcp Per   Brief Narrative:  51 year old male with history of recent hospitalization at at an outside facility with GI bleed secondary to gastritis from NSAID use for gout requiring blood transfusions presented on 01/24/2017 with complaints of left knee pain.  Patient had left knee arthrocentesis done in the ED and was started on oral colchicine for probable gout flareup.   Assessment & Plan:   Principal Problem:   Hypokalemia Active Problems:   Gout attack   Gastritis   h/o recent GIB (gastrointestinal bleeding)   Hypomagnesemia   Left knee pain/swelling/erythema -Probably secondary to gout flareup.  Cannot rule out septic arthritis.  Patient had arthrocentesis done in the ED which showed synovial fluid white count of 61,500 with 97% neutrophils along with intracellular monosodium urate crystals -Currently on Levaquin.  Spiking temperatures.  Will switch antibiotics to Rocephin and vancomycin.  Increase IV fluids to normal saline at 125 cc an hour.  Follow blood and synovial fluid cultures. -Spoke to orthopedics on call/Dr. Victorino DikeHewitt and discussed the plan of care.  Will keep him n.p.o. pending further orthopedics evaluation. - X-ray of the left knee -Repeat a.m. labs including CRP -Continue colchicine and allopurinol.  Continue pain management  Leukocytosis -Probably secondary to above.  Repeat a.m. labs  Right wrist pain -Unclear if this is polyarticular gout flare -X-ray of the right wrist  Macrocytic anemia -Questionable cause.  Patient had recent admission for GI bleed and had transfusions at the other hospital.  Follow-up with primary care provider -Repeat a.m. labs including B12, TSH, folate  Hypokalemia -Replace.  Repeat a.m. Labs  History of gout with probable acute gout flare -As above  Gastritis  with history of recent GI bleeding -Hemoglobin stable.  Continue Protonix   DVT prophylaxis: SCDs Code Status: Full Family Communication: None at bedside Disposition Plan: Depends on clinical outcome  Consultants: Orthopedics  Procedures: None  Antimicrobials: Levaquin from 01/24/2017 onwards  Subjective: Patient seen and examined at bedside.  He still complains of left knee pain but feels slightly better.  He is having temperature spikes.  No nausea or vomiting.  Complains of right wrist pain  Objective: Vitals:   01/24/17 2012 01/25/17 0432 01/25/17 0523 01/25/17 1128  BP: 139/85 (!) 151/84  (!) 141/94  Pulse: (!) 116 (!) 117  (!) 127  Resp: 20 20  (!) 22  Temp: 100 F (37.8 C) (!) 101.3 F (38.5 C) 98.4 F (36.9 C) (!) 102.7 F (39.3 C)  TempSrc: Oral Oral Oral Oral  SpO2: 98% 98%    Weight:      Height:        Intake/Output Summary (Last 24 hours) at 01/25/2017 1222 Last data filed at 01/25/2017 0433 Gross per 24 hour  Intake 901.25 ml  Output 501 ml  Net 400.25 ml   Filed Weights   01/24/17 1321  Weight: 101.2 kg (223 lb)    Examination:  General exam: Appears calm and comfortable  Respiratory system: Bilateral decreased breath sound at bases; intermittent tachypnea Cardiovascular system: S1 & S2 heard, tachycardic  gastrointestinal system: Abdomen is nondistended, soft and nontender. Normal bowel sounds heard. Central nervous system: Alert and oriented. No focal neurological deficits. Moving extremities Extremities: No cyanosis, clubbing, edema  Musculoskeletal: Left knee is swollen/erythematous and extremely tender with decreased range of motion.  Right wrist is also slightly swollen with decreased range of motion Skin: No other rashes, lesions or ulcers Psychiatry: Judgement and insight appear normal. Mood & affect appropriate.     Data Reviewed: I have personally reviewed following labs and imaging studies  CBC: Recent Labs  Lab 01/24/17 0855  01/25/17 0353  WBC 17.4* 16.3*  NEUTROABS 14.1*  --   HGB 8.6* 8.1*  HCT 24.9* 24.0*  MCV 104.2* 104.3*  PLT 284 254   Basic Metabolic Panel: Recent Labs  Lab 01/24/17 0855 01/24/17 1124 01/24/17 1425 01/25/17 0353  NA 137  --  138 133*  K 2.4*  --  3.0* 3.3*  CL 89*  --  92* 92*  CO2 30  --  31 28  GLUCOSE 167*  --  153* 130*  BUN 18  --  16 21*  CREATININE 1.06  --  0.92 1.15  CALCIUM 7.0*  --  6.7* 6.5*  MG  --  0.7* 0.8*  --    GFR: Estimated Creatinine Clearance: 94.2 mL/min (by C-G formula based on SCr of 1.15 mg/dL). Liver Function Tests: Recent Labs  Lab 01/24/17 0855  AST 49*  ALT 26  ALKPHOS 101  BILITOT 2.0*  PROT 7.4  ALBUMIN 3.4*   No results for input(s): LIPASE, AMYLASE in the last 168 hours. No results for input(s): AMMONIA in the last 168 hours. Coagulation Profile: No results for input(s): INR, PROTIME in the last 168 hours. Cardiac Enzymes: No results for input(s): CKTOTAL, CKMB, CKMBINDEX, TROPONINI in the last 168 hours. BNP (last 3 results) No results for input(s): PROBNP in the last 8760 hours. HbA1C: No results for input(s): HGBA1C in the last 72 hours. CBG: Recent Labs  Lab 01/24/17 1249  GLUCAP 134*   Lipid Profile: No results for input(s): CHOL, HDL, LDLCALC, TRIG, CHOLHDL, LDLDIRECT in the last 72 hours. Thyroid Function Tests: No results for input(s): TSH, T4TOTAL, FREET4, T3FREE, THYROIDAB in the last 72 hours. Anemia Panel: No results for input(s): VITAMINB12, FOLATE, FERRITIN, TIBC, IRON, RETICCTPCT in the last 72 hours. Sepsis Labs: No results for input(s): PROCALCITON, LATICACIDVEN in the last 168 hours.  Recent Results (from the past 240 hour(s))  Body fluid culture     Status: None (Preliminary result)   Collection Time: 01/24/17 12:32 PM  Result Value Ref Range Status   Specimen Description SYNOVIAL LEFT KNEE  Final   Special Requests NONE  Final   Gram Stain   Final    ABUNDANT WBC PRESENT, PREDOMINANTLY  PMN NO ORGANISMS SEEN Gram Stain Report Called to,Read Back By and Verified With: HUFF, J. RN @1405  ON 12.26.18 BY NMCCOY    Culture   Final    NO GROWTH < 24 HOURS Performed at [facility], [address], [place], [place] [address]    Report Status PENDING  Incomplete         Radiology Studies: Dg Chest 2 View  Result Date: 01/24/2017 CLINICAL DATA:  High fever. EXAM: CHEST  2 VIEW COMPARISON:  None. FINDINGS: Heart size is normal. Mediastinal shadows are normal. Abnormal density on the lateral view overlying the lower spine is consistent with lower lobe pneumonia. Side indeterminate based on the frontal view. No effusions. No acute bone finding. IMPRESSION: Lower lobe pneumonia visible on the lateral view, side indeterminate. Electronically Signed   By: Paulina Fusi M.D.   On: 01/24/2017 15:54   Dg Wrist 2 Views Right  Result Date: 01/25/2017 CLINICAL DATA:  Radial side swelling.  History of gout. EXAM: RIGHT WRIST - 2 VIEW COMPARISON:  None. FINDINGS: No evidence of fracture. No visible bone erosion. 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 IMPRESSION: Indistinct hyperdensity dorsal to the carpal bones, which could be consistent with gout tophus given the history. Electronically Signed   By: Paulina FusiMark  Shogry M.D.   On: 01/25/2017 10:20   Dg Knee Complete 4 Views Left  Result Date: 01/25/2017 CLINICAL DATA:  Pain and swelling of the left knee. EXAM: LEFT KNEE - COMPLETE 4+ VIEW COMPARISON:  None. FINDINGS: Large knee joint effusion. Previous tunneled ACL repair. Hardware appears unremarkable. Degenerative arthritis with joint space narrowing and osteophytes, worse in the medial compartment. Patellofemoral osteoarthritis as well. IMPRESSION: Large knee joint effusion. Previous tunneled ACL repair. Osteoarthritis, more advanced in the medial compartment and patellofemoral joint and lateral compartment. Electronically Signed   By: Paulina FusiMark   Shogry M.D.   On: 01/25/2017 10:19        Scheduled Meds: . allopurinol  100 mg Oral Daily  . amLODipine  10 mg Oral Daily  . colchicine  0.6 mg Oral Once   Followed by  . colchicine  0.6 mg Oral Daily  . hydrALAZINE  50 mg Oral TID  . lidocaine  10 mL Other Once  . pantoprazole  40 mg Oral Daily  . triamcinolone acetonide  80 mg Intra-articular Once   Continuous Infusions: . 0.9 % NaCl with KCl 40 mEq / L 75 mL/hr (01/25/17 1120)  . cefTRIAXone (ROCEPHIN)  IV Stopped (01/25/17 1214)  . vancomycin 2,000 mg (01/25/17 1212)  . vancomycin       LOS: 0 days        Glade LloydKshitiz Shamyah Stantz, MD [facility] Pager [phone]  If 7PM-7AM, please contact night-coverage [email or web address] Password [email or web address] 01/25/2017, 12:22 PM

## 2017-01-25 NOTE — Consult Note (Signed)
Reason for Consult:Left knee effusion Referring Physician: Cyndi Bender  Scott [name] is an 51 y.o. male.  HPI: Labaron began to have knee pain on Monday. He has a long history of gout and flares 1-2 times/year. He is not usually febrile with them. He says this feels exactly like his gout flares. He used to take NSAID's for his flares but developed a gastric ulcer and had to stop. This happened just a couple of weeks ago. He was started on colchicine but barely had time to start it before he started to flare again. IM was concerned with septic arthritis and asked orthopedics to consult.  Past Medical History:  Diagnosis Date  . Gout   . Hypertension     History reviewed. No pertinent surgical history.  History reviewed. No pertinent family history.  Social History:  reports that  has never smoked. he has never used smokeless tobacco. He reports that he does not drink alcohol or use drugs.  Allergies:  Allergies  Allergen Reactions  . Ace Inhibitors Swelling    Facial swelling    Medications: I have reviewed the patient's current medications.  Results for orders placed or performed during the hospital encounter of 01/24/17 (from the past 48 hour(s))  Comprehensive metabolic panel     Status: Abnormal   Collection Time: 01/24/17  8:55 AM  Result Value Ref Range   Sodium 137 135 - 145 mmol/L   Potassium 2.4 (LL) 3.5 - 5.1 mmol/L    Comment: CRITICAL RESULT CALLED TO, READ BACK BY AND VERIFIED WITH: S WEST,RN 01/24/17 1022 RHOLMES    Chloride 89 (L) 101 - 111 mmol/L   CO2 30 22 - 32 mmol/L   Glucose, Bld 167 (H) 65 - 99 mg/dL   BUN 18 6 - 20 mg/dL   Creatinine, Ser 1.06 0.61 - 1.24 mg/dL   Calcium 7.0 (L) 8.9 - 10.3 mg/dL   Total Protein 7.4 6.5 - 8.1 g/dL   Albumin 3.4 (L) 3.5 - 5.0 g/dL   AST 49 (H) 15 - 41 U/L   ALT 26 17 - 63 U/L   Alkaline Phosphatase 101 38 - 126 U/L   Total Bilirubin 2.0 (H) 0.3 - 1.2 mg/dL   GFR calc non Af Amer >60 >60 mL/min   GFR calc Af Amer >60 >60  mL/min    Comment: (NOTE) The eGFR has been calculated using the CKD EPI equation. This calculation has not been validated in all clinical situations. eGFR's persistently <60 mL/min signify possible Chronic Kidney Disease.    Anion gap 18 (H) 5 - 15  CBC with Differential     Status: Abnormal   Collection Time: 01/24/17  8:55 AM  Result Value Ref Range   WBC 17.4 (H) 4.0 - 10.5 K/uL   RBC 2.39 (L) 4.22 - 5.81 MIL/uL   Hemoglobin 8.6 (L) 13.0 - 17.0 g/dL   HCT 24.9 (L) 39.0 - 52.0 %   MCV 104.2 (H) 78.0 - 100.0 fL   MCH 36.0 (H) 26.0 - 34.0 pg   MCHC 34.5 30.0 - 36.0 g/dL   RDW 21.6 (H) 11.5 - 15.5 %   Platelets 284 150 - 400 K/uL   Neutrophils Relative % 81 %   Lymphocytes Relative 8 %   Monocytes Relative 11 %   Eosinophils Relative 0 %   Basophils Relative 0 %   Neutro Abs 14.1 (H) 1.7 - 7.7 K/uL   Lymphs Abs 1.4 0.7 - 4.0 K/uL   Monocytes Absolute 1.9 (  H) 0.1 - 1.0 K/uL   Eosinophils Absolute 0.0 0.0 - 0.7 K/uL   Basophils Absolute 0.0 0.0 - 0.1 K/uL   RBC Morphology TARGET CELLS     Comment: POLYCHROMASIA PRESENT  Sample to Blood Bank     Status: None   Collection Time: 01/24/17  8:55 AM  Result Value Ref Range   Blood Bank Specimen SAMPLE AVAILABLE FOR TESTING    Sample Expiration 01/27/2017   Uric acid     Status: Abnormal   Collection Time: 01/24/17  8:55 AM  Result Value Ref Range   Uric Acid, Serum 8.7 (H) 4.4 - 7.6 mg/dL  Sedimentation rate     Status: Abnormal   Collection Time: 01/24/17  8:55 AM  Result Value Ref Range   Sed Rate 84 (H) 0 - 16 mm/hr  Magnesium     Status: Abnormal   Collection Time: 01/24/17 11:24 AM  Result Value Ref Range   Magnesium 0.7 (LL) 1.7 - 2.4 mg/dL    Comment: CRITICAL RESULT CALLED TO, READ BACK BY AND VERIFIED WITH: S WEST,RN 01/24/17 1218 RHOLMES   Body fluid culture     Status: None (Preliminary result)   Collection Time: 01/24/17 12:32 PM  Result Value Ref Range   Specimen Description SYNOVIAL LEFT KNEE    Special  Requests NONE    Gram Stain      ABUNDANT WBC PRESENT, PREDOMINANTLY PMN NO ORGANISMS SEEN Gram Stain Report Called to,Read Back By and Verified With: HUFF, J. RN '@1405'  ON 12.26.18 BY NMCCOY    Culture      NO GROWTH < 24 HOURS Performed at [facility], [place] [address] [place], [place] [address]    Report Status PENDING   Cell count + diff,  w/ cryst-synvl fld     Status: Abnormal   Collection Time: 01/24/17 12:32 PM  Result Value Ref Range   Color, Synovial YELLOW YELLOW   Appearance-Synovial TURBID (A) CLEAR   Crystals, Fluid INTRACELLULAR MONOSODIUM URATE CRYSTALS     Comment: EXTRACELLULAR MONOSODIUM URATE CRYSTALS   WBC, Synovial 61,500 (H) 0 - 200 /cu mm   Neutrophil, Synovial 97 (H) 0 - 25 %   Monocyte-Macrophage-Synovial Fluid 3 (L) 50 - 90 %  CBG monitoring, ED     Status: Abnormal   Collection Time: 01/24/17 12:49 PM  Result Value Ref Range   Glucose-Capillary 134 (H) 65 - 99 mg/dL  Basic metabolic panel     Status: Abnormal   Collection Time: 01/24/17  2:25 PM  Result Value Ref Range   Sodium 138 135 - 145 mmol/L   Potassium 3.0 (L) 3.5 - 5.1 mmol/L    Comment: DELTA CHECK NOTED NO VISIBLE HEMOLYSIS    Chloride 92 (L) 101 - 111 mmol/L   CO2 31 22 - 32 mmol/L   Glucose, Bld 153 (H) 65 - 99 mg/dL   BUN 16 6 - 20 mg/dL   Creatinine, Ser 0.92 0.61 - 1.24 mg/dL   Calcium 6.7 (L) 8.9 - 10.3 mg/dL   GFR calc non Af Amer >60 >60 mL/min   GFR calc Af Amer >60 >60 mL/min    Comment: (NOTE) The eGFR has been calculated using the CKD EPI equation. This calculation has not been validated in all clinical situations. eGFR's persistently <60 mL/min signify possible Chronic Kidney Disease.    Anion gap 15 5 - 15  Magnesium     Status: Abnormal   Collection Time: 01/24/17  2:25 PM  Result  Value Ref Range   Magnesium 0.8 (LL) 1.7 - 2.4 mg/dL    Comment: CRITICAL RESULT CALLED TO, READ BACK BY AND VERIFIED WITH: J HUFF,RN 01/24/17 1518 RHOLMES   Urinalysis,  Routine w reflex microscopic     Status: Abnormal   Collection Time: 01/24/17  3:04 PM  Result Value Ref Range   Color, Urine AMBER (A) YELLOW    Comment: BIOCHEMICALS MAY BE AFFECTED BY COLOR   APPearance CLEAR CLEAR   Specific Gravity, Urine 1.019 1.005 - 1.030   pH 5.0 5.0 - 8.0   Glucose, UA NEGATIVE NEGATIVE mg/dL   Hgb urine dipstick NEGATIVE NEGATIVE   Bilirubin Urine NEGATIVE NEGATIVE   Ketones, ur NEGATIVE NEGATIVE mg/dL   Protein, ur 100 (A) NEGATIVE mg/dL   Nitrite NEGATIVE NEGATIVE   Leukocytes, UA NEGATIVE NEGATIVE   RBC / HPF 0-5 0 - 5 RBC/hpf   WBC, UA 0-5 0 - 5 WBC/hpf   Bacteria, UA RARE (A) NONE SEEN   Squamous Epithelial / LPF NONE SEEN NONE SEEN   Mucus PRESENT   Culture, blood (routine x 2)     Status: None (Preliminary result)   Collection Time: 01/24/17  3:18 PM  Result Value Ref Range   Specimen Description BLOOD LEFT HAND    Special Requests      BOTTLES DRAWN AEROBIC AND ANAEROBIC Blood Culture adequate volume   Culture      NO GROWTH < 24 HOURS Performed at [facility], [address], [place], [place] [address]    Report Status PENDING   Culture, blood (routine x 2)     Status: None (Preliminary result)   Collection Time: 01/24/17  3:18 PM  Result Value Ref Range   Specimen Description BLOOD LEFT HAND    Special Requests      BOTTLES DRAWN AEROBIC AND ANAEROBIC Blood Culture adequate volume   Culture      NO GROWTH < 24 HOURS Performed at [facility], [place] [address] [place],  [address]    Report Status PENDING   Basic metabolic panel     Status: Abnormal   Collection Time: 01/25/17  3:53 AM  Result Value Ref Range   Sodium 133 (L) 135 - 145 mmol/L   Potassium 3.3 (L) 3.5 - 5.1 mmol/L   Chloride 92 (L) 101 - 111 mmol/L   CO2 28 22 - 32 mmol/L   Glucose, Bld 130 (H) 65 - 99 mg/dL   BUN 21 (H) 6 - 20 mg/dL   Creatinine, Ser 1.15 0.61 - 1.24 mg/dL   Calcium 6.5 (L) 8.9 - 10.3 mg/dL   GFR calc non Af Amer >60 >60 mL/min    GFR calc Af Amer >60 >60 mL/min    Comment: (NOTE) The eGFR has been calculated using the CKD EPI equation. This calculation has not been validated in all clinical situations. eGFR's persistently <60 mL/min signify possible Chronic Kidney Disease.    Anion gap 13 5 - 15  CBC     Status: Abnormal   Collection Time: 01/25/17  3:53 AM  Result Value Ref Range   WBC 16.3 (H) 4.0 - 10.5 K/uL   RBC 2.30 (L) 4.22 - 5.81 MIL/uL   Hemoglobin 8.1 (L) 13.0 - 17.0 g/dL   HCT 24.0 (L) 39.0 - 52.0 %   MCV 104.3 (H) 78.0 - 100.0 fL   MCH 35.2 (H) 26.0 - 34.0 pg   MCHC 33.8 30.0 - 36.0 g/dL   RDW  21.3 (H) 11.5 - 15.5 %   Platelets 254 150 - 400 K/uL    Dg Chest 2 View  Result Date: 01/24/2017 CLINICAL DATA:  High fever. EXAM: CHEST  2 VIEW COMPARISON:  None. FINDINGS: Heart size is normal. Mediastinal shadows are normal. Abnormal density on the lateral view overlying the lower spine is consistent with lower lobe pneumonia. Side indeterminate based on the frontal view. No effusions. No acute bone finding. IMPRESSION: Lower lobe pneumonia visible on the lateral view, side indeterminate. Electronically Signed   By: Nelson Chimes M.D.   On: 01/24/2017 15:54   Dg Wrist 2 Views Right  Result Date: 01/25/2017 CLINICAL DATA:  Radial side swelling.  History of gout. EXAM: RIGHT WRIST - 2 VIEW COMPARISON:  None. FINDINGS: No evidence of fracture. No visible bone erosion. 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 IMPRESSION: Indistinct hyperdensity dorsal to the carpal bones, which could be consistent with gout tophus given the history. Electronically Signed   By: Nelson Chimes M.D.   On: 01/25/2017 10:20   Dg Knee Complete 4 Views Left  Result Date: 01/25/2017 CLINICAL DATA:  Pain and swelling of the left knee. EXAM: LEFT KNEE - COMPLETE 4+ VIEW COMPARISON:  None. FINDINGS: Large knee joint effusion. Previous tunneled ACL repair. Hardware appears unremarkable.  Degenerative arthritis with joint space narrowing and osteophytes, worse in the medial compartment. Patellofemoral osteoarthritis as well. IMPRESSION: Large knee joint effusion. Previous tunneled ACL repair. Osteoarthritis, more advanced in the medial compartment and patellofemoral joint and lateral compartment. Electronically Signed   By: Nelson Chimes M.D.   On: 01/25/2017 10:19    Review of Systems  Constitutional: Positive for fever. Negative for weight loss.  HENT: Negative for ear discharge, ear pain, hearing loss and tinnitus.   Eyes: Negative for blurred vision, double vision, photophobia and pain.  Respiratory: Negative for cough, sputum production and shortness of breath.   Cardiovascular: Negative for chest pain.  Gastrointestinal: Negative for abdominal pain, nausea and vomiting.  Genitourinary: Negative for dysuria, flank pain, frequency and urgency.  Musculoskeletal: Positive for joint pain (Left knee, right wrist, upper back). Negative for back pain, falls, myalgias and neck pain.  Neurological: Negative for dizziness, tingling, sensory change, focal weakness, loss of consciousness and headaches.  Endo/Heme/Allergies: Does not bruise/bleed easily.  Psychiatric/Behavioral: Negative for depression, memory loss and substance abuse. The patient is not nervous/anxious.    Blood pressure (!) 144/89, pulse (!) 114, temperature 100.1 F (37.8 C), temperature source Oral, resp. rate 20, height '5\' 11"'  (1.803 m), weight 101.2 kg (223 lb), SpO2 95 %. Physical Exam  Constitutional: He appears well-developed and well-nourished. No distress.  HENT:  Head: Normocephalic.  Eyes: Conjunctivae are normal. Right eye exhibits no discharge. Left eye exhibits no discharge. No scleral icterus.  Neck: Normal range of motion.  Cardiovascular: Normal rate and regular rhythm.  Respiratory: Effort normal. No respiratory distress.  Musculoskeletal:  Right shoulder, elbow, wrist, digits- no skin wounds,  wrist painful with A/PROM, no instability, no blocks to motion  Sens  Ax/R/M/U intact  Mot   Ax/ R/ PIN/ M/ AIN/ U intact  Rad 2+  LLE No traumatic wounds, ecchymosis, or rash  Knee severely painful with A/PROM, limited to 5/10   Large knee effusion, no erythema  Knee stability unable to assess  Sens DPN, SPN, TN intact  Motor EHL, ext, flex, evers 5/5  DP 2+, PT 1+, 2+ edema (bilateral)  Neurological: He is  alert.  Skin: Skin is warm and dry. He is not diaphoretic.  Psychiatric: He has a normal mood and affect. His behavior is normal.    Assessment/Plan: Left knee effusion -- Aspirated 60+ml turbid yellow (but not purulent) fluid from knee. Lidocaine and depo-medrol instilled. Am not concerned with possibility of septic joint, colchicine of uncertain utility this late in flare. Would add prednisone to regimen, especially given new involvement of LUE. Please call with questions.    Lisette Abu, PA-C Orthopedic Surgery [phone] 01/25/2017, 2:10 PM   Pt seen and examined.  Agree with note above.  Pt's hx and PE are consistent with gout.  Lab findings support a diagnosis of gout.  Pyarthrosis of the left knee is much less likely.  Steroid injection of the knee after repeat aspiration will likely improve his symptoms significantly.  Agree with recommendation for systemic steroids.  We'll follow.

## 2017-01-26 ENCOUNTER — Observation Stay (HOSPITAL_COMMUNITY): Payer: 59

## 2017-01-26 DIAGNOSIS — M109 Gout, unspecified: Secondary | ICD-10-CM | POA: Diagnosis not present

## 2017-01-26 DIAGNOSIS — K259 Gastric ulcer, unspecified as acute or chronic, without hemorrhage or perforation: Secondary | ICD-10-CM | POA: Diagnosis present

## 2017-01-26 DIAGNOSIS — R6520 Severe sepsis without septic shock: Secondary | ICD-10-CM | POA: Diagnosis present

## 2017-01-26 DIAGNOSIS — E876 Hypokalemia: Secondary | ICD-10-CM | POA: Diagnosis present

## 2017-01-26 DIAGNOSIS — D539 Nutritional anemia, unspecified: Secondary | ICD-10-CM | POA: Diagnosis present

## 2017-01-26 DIAGNOSIS — M79609 Pain in unspecified limb: Secondary | ICD-10-CM | POA: Diagnosis not present

## 2017-01-26 DIAGNOSIS — I10 Essential (primary) hypertension: Secondary | ICD-10-CM | POA: Diagnosis present

## 2017-01-26 DIAGNOSIS — D649 Anemia, unspecified: Secondary | ICD-10-CM | POA: Diagnosis not present

## 2017-01-26 DIAGNOSIS — Z888 Allergy status to other drugs, medicaments and biological substances status: Secondary | ICD-10-CM | POA: Diagnosis not present

## 2017-01-26 DIAGNOSIS — M25562 Pain in left knee: Secondary | ICD-10-CM | POA: Diagnosis present

## 2017-01-26 DIAGNOSIS — M7989 Other specified soft tissue disorders: Secondary | ICD-10-CM | POA: Diagnosis not present

## 2017-01-26 DIAGNOSIS — M10062 Idiopathic gout, left knee: Secondary | ICD-10-CM | POA: Diagnosis present

## 2017-01-26 DIAGNOSIS — N179 Acute kidney failure, unspecified: Secondary | ICD-10-CM | POA: Diagnosis present

## 2017-01-26 DIAGNOSIS — A419 Sepsis, unspecified organism: Secondary | ICD-10-CM | POA: Diagnosis present

## 2017-01-26 DIAGNOSIS — J181 Lobar pneumonia, unspecified organism: Secondary | ICD-10-CM | POA: Diagnosis present

## 2017-01-26 DIAGNOSIS — K2970 Gastritis, unspecified, without bleeding: Secondary | ICD-10-CM | POA: Diagnosis present

## 2017-01-26 LAB — CBC WITH DIFFERENTIAL/PLATELET
BASOS ABS: 0 10*3/uL (ref 0.0–0.1)
Basophils Relative: 0 %
EOS ABS: 0 10*3/uL (ref 0.0–0.7)
Eosinophils Relative: 0 %
HEMATOCRIT: 22 % — AB (ref 39.0–52.0)
HEMOGLOBIN: 7.5 g/dL — AB (ref 13.0–17.0)
LYMPHS PCT: 7 %
Lymphs Abs: 1.2 10*3/uL (ref 0.7–4.0)
MCH: 35.5 pg — ABNORMAL HIGH (ref 26.0–34.0)
MCHC: 34.1 g/dL (ref 30.0–36.0)
MCV: 104.3 fL — ABNORMAL HIGH (ref 78.0–100.0)
MONOS PCT: 9 %
Monocytes Absolute: 1.5 10*3/uL — ABNORMAL HIGH (ref 0.1–1.0)
NEUTROS PCT: 84 %
Neutro Abs: 13.9 10*3/uL — ABNORMAL HIGH (ref 1.7–7.7)
Platelets: 234 10*3/uL (ref 150–400)
RBC: 2.11 MIL/uL — AB (ref 4.22–5.81)
RDW: 21.3 % — ABNORMAL HIGH (ref 11.5–15.5)
WBC: 16.6 10*3/uL — AB (ref 4.0–10.5)

## 2017-01-26 LAB — GLUCOSE, BODY FLUID OTHER

## 2017-01-26 LAB — VITAMIN B12: VITAMIN B 12: 358 pg/mL (ref 180–914)

## 2017-01-26 LAB — TSH: TSH: 1.039 u[IU]/mL (ref 0.350–4.500)

## 2017-01-26 LAB — COMPREHENSIVE METABOLIC PANEL
ALBUMIN: 2.5 g/dL — AB (ref 3.5–5.0)
ALK PHOS: 92 U/L (ref 38–126)
ALT: 22 U/L (ref 17–63)
ANION GAP: 11 (ref 5–15)
AST: 44 U/L — AB (ref 15–41)
BILIRUBIN TOTAL: 1.3 mg/dL — AB (ref 0.3–1.2)
BUN: 31 mg/dL — AB (ref 6–20)
CALCIUM: 7.1 mg/dL — AB (ref 8.9–10.3)
CO2: 25 mmol/L (ref 22–32)
Chloride: 100 mmol/L — ABNORMAL LOW (ref 101–111)
Creatinine, Ser: 1.59 mg/dL — ABNORMAL HIGH (ref 0.61–1.24)
GFR calc Af Amer: 57 mL/min — ABNORMAL LOW (ref >60)
GFR calc non Af Amer: 49 mL/min — ABNORMAL LOW (ref >60)
GLUCOSE: 187 mg/dL — AB (ref 65–99)
Potassium: 4.8 mmol/L (ref 3.5–5.1)
SODIUM: 136 mmol/L (ref 135–145)
TOTAL PROTEIN: 6.5 g/dL (ref 6.5–8.1)

## 2017-01-26 LAB — C-REACTIVE PROTEIN: CRP: 39.6 mg/dL — ABNORMAL HIGH (ref <1.0)

## 2017-01-26 LAB — LEGIONELLA PNEUMOPHILA SEROGP 1 UR AG: L. PNEUMOPHILA SEROGP 1 UR AG: NEGATIVE

## 2017-01-26 LAB — MAGNESIUM: Magnesium: 1.3 mg/dL — ABNORMAL LOW (ref 1.7–2.4)

## 2017-01-26 MED ORDER — AZITHROMYCIN 250 MG PO TABS
500.0000 mg | ORAL_TABLET | Freq: Every day | ORAL | Status: DC
  Administered 2017-01-26 – 2017-01-31 (×6): 500 mg via ORAL
  Filled 2017-01-26 (×6): qty 2

## 2017-01-26 MED ORDER — MAGNESIUM SULFATE 2 GM/50ML IV SOLN
2.0000 g | Freq: Once | INTRAVENOUS | Status: AC
  Administered 2017-01-26: 2 g via INTRAVENOUS
  Filled 2017-01-26: qty 50

## 2017-01-26 MED ORDER — ALLOPURINOL 100 MG PO TABS
100.0000 mg | ORAL_TABLET | Freq: Every day | ORAL | Status: DC
  Administered 2017-01-27 – 2017-01-31 (×5): 100 mg via ORAL
  Filled 2017-01-26 (×5): qty 1

## 2017-01-26 MED ORDER — DEXTROSE 5 % IV SOLN
1.0000 g | INTRAVENOUS | Status: DC
  Administered 2017-01-26 – 2017-01-27 (×2): 1 g via INTRAVENOUS
  Filled 2017-01-26 (×2): qty 10

## 2017-01-26 MED ORDER — SODIUM CHLORIDE 0.9 % IV SOLN
INTRAVENOUS | Status: DC
Start: 2017-01-26 — End: 2017-01-28
  Administered 2017-01-26 – 2017-01-28 (×5): via INTRAVENOUS

## 2017-01-26 NOTE — Progress Notes (Addendum)
Pharmacy Antibiotic Note  Scott [name] is a 51 y.o. male admitted on 01/24/2017 with L knee effusion thought due to gout flare, also r/o septic arthritis.  CXR 12/26 was read as LLL pneumonia.  Pharmacy was consulted for dosing of empiric vancomycin and ceftriaxone.  Today, 01/26/2017: D#3 empiric abx D#2 vancomycin (received 2 grams IV x 1, then began 1000 mg IV q12h [goal AUC 400-550]) D#2 ceftriaxone 2 grams IV q24h SCr rising, 1.59 this AM Orthopedics consult note from yesterday afternoon now suggests septic joint is unlikely  Plan:  Hold vancomycin for now secondary to rising serum creatinine  Check random vancomycin level today at 10 am (~ trough timing on current regimen)  Given new information from orthopedics consult, suggest considering discontinuation of vancomycin  Height: 5\' 11"  (180.3 cm) Weight: 223 lb (101.2 kg) IBW/kg (Calculated) : 75.3  Temp (24hrs), Avg:100.8 F (38.2 C), Min:99.1 F (37.3 C), Max:102.7 F (39.3 C)  Recent Labs  Lab 01/24/17 0855 01/24/17 1425 01/25/17 0353 01/26/17 0359  WBC 17.4*  --  16.3* 16.6*  CREATININE 1.06 0.92 1.15 1.59*    Estimated Creatinine Clearance: 68.1 mL/min (A) (by C-G formula based on SCr of 1.59 mg/dL (H)).    Allergies  Allergen Reactions  . Ace Inhibitors Swelling    Facial swelling    Antimicrobials this admission: 12/26  levofloxacin x 1 12/27  vancomycin >> 12/27  ceftriaxone >>  Dose adjustments this admission: 12/28 vancomycin held secondary to rising SCr  Microbiology results: 12/26 BCx: ngtd 12/26 Synovial, L knee: ngtd  Thank you for allowing pharmacy to be a part of this patient's care.  Elie Goodyandy Tacori Kvamme, PharmD, BCPS Pager: [phone] 01/26/2017  5:45 AM

## 2017-01-26 NOTE — Progress Notes (Addendum)
Subjective:     Patient reports pain as mild to moderate.  Reports that his pain has improved since aspiration and injection of his knee yesterday.  Continues to c/o of pain with knee ROM.  Denies fever, chills, N/V, SOB, CP.  Reports that he has had gouty flare ups similar to this in the past.  Objective:   VITALS:  Temp:  [99.1 F (37.3 C)-102.7 F (39.3 C)] 99.1 F (37.3 C) (12/28 0406) Pulse Rate:  [98-127] 98 (12/28 0406) Resp:  [19-22] 19 (12/28 0406) BP: (130-154)/(80-97) 130/80 (12/28 0406) SpO2:  [95 %-98 %] 96 % (12/28 0406)  General: WDWN patient in NAD. Psych:  Appropriate mood and affect. Neuro:  A&O x 3, Moving all extremities, sensation intact to light touch HEENT:  EOMs intact Chest:  Even non-labored respirations Skin:  C/D/I, no rashes or lesions Extremities: warm/dry, moderate edema, no erythema or echymosis.  No lymphadenopathy. Pulses: Popliteus 2+ MSK:  ROM: limited at the knee 10-30 degrees; full ankle ROM, MMT: able to perform quad set, (-) Homan's    LABS Recent Labs    01/24/17 0855 01/25/17 0353 01/26/17 0359  HGB 8.6* 8.1* 7.5*  WBC 17.4* 16.3* 16.6*  PLT 284 254 234   Recent Labs    01/25/17 0353 01/26/17 0359  NA 133* 136  K 3.3* 4.8  CL 92* 100*  CO2 28 25  BUN 21* 31*  CREATININE 1.15 1.59*  GLUCOSE 130* 187*   No results for input(s): LABPT, INR in the last 72 hours.   Assessment/Plan:     Aspirate culture positive for urate crystals and elevated WBC, consistent with reactive effusion due to gouty flare up.   Will continue to follow cultures until final. Recommend system steroids. WBAT L LE  Alfredo MartinezJustin Coda Filler, PA-C [facility] Office:  [phone]

## 2017-01-26 NOTE — Progress Notes (Signed)
PROGRESS NOTE    Scott EvenerKurk Bornhorst  BJY:[id number] DOB: 07/27/1965 DOA: 01/24/2017 PCP: Patient, No Pcp Per     Brief Narrative:  Scott [name] is a  51 year old male with history of recent hospitalization at at an outside facility with GI bleed secondary to gastritis from NSAID use for gout requiring blood transfusions [facility]2017 with complaints of left knee pain. Patient had left knee arthrocentesis done in the ED to rule out septic arthritis. He was started on oral colchicine and oral prednisone for probable gout flareup.  Assessment & Plan:   Principal Problem:   Hypokalemia Active Problems:   Gout attack   Gastritis   h/o recent GIB (gastrointestinal bleeding)   Hypomagnesemia   Gout flare -Left knee arthrocentesis revealed synovial fluid white count 61,500 with 97% neutrophils along with intracellular monosodium urate crystals.  Culture is pending, no growth 2 days -Orthopedic surgery consulted -Currently on systemic prednisone, colchicine  Sepsis secondary to left lower lobar pneumonia -Change antibiotics to Rocephin and azithromycin -Fever 102.3, tachycardia, tachypnea and leukocytosis.  These could also be reactive secondary to gout flare.  Chest x-ray this morning revealed developing left lobe pneumonia -Strep pneumo negative   AKI  -Cr 1.15 --> 1.59 -IVF started. Stop vanco.   Macrocytic anemia -Recent admission for GI bleed and had transfusions  Hypomagnesemia -Replace, trend  Gastritis with recent GI bleeding -Hemoglobin stable, continue Protonix   DVT prophylaxis: SCD Code Status: Full Family Communication: No family at bedside Disposition Plan: Pending improvement   Consultants:   Orthopedic surgery   Procedures:   Left knee arthrocentesis   Antimicrobials:  Anti-infectives (From admission, onward)   Start     Dose/Rate Route Frequency Ordered Stop   01/26/17 1000  cefTRIAXone (ROCEPHIN) 1 g in dextrose 5 % 50  mL IVPB     1 g 100 mL/hr over 30 Minutes Intravenous Every 24 hours 01/26/17 0937     01/26/17 1000  azithromycin (ZITHROMAX) tablet 500 mg     500 mg Oral Daily 01/26/17 0937     01/25/17 2200  vancomycin (VANCOCIN) IVPB 1000 mg/200 mL premix  Status:  Discontinued     1,000 mg 200 mL/hr over 60 Minutes Intravenous Every 12 hours 01/25/17 1112 01/26/17 0531   01/25/17 2000  vancomycin (VANCOCIN) 1,250 mg in sodium chloride 0.9 % 250 mL IVPB  Status:  Discontinued     1,250 mg 166.7 mL/hr over 90 Minutes Intravenous Every 8 hours 01/25/17 0933 01/25/17 1112   01/25/17 1100  vancomycin (VANCOCIN) 2,000 mg in sodium chloride 0.9 % 500 mL IVPB     2,000 mg 250 mL/hr over 120 Minutes Intravenous  Once 01/25/17 0933 01/25/17 1523   01/25/17 1000  cefTRIAXone (ROCEPHIN) 2 g in dextrose 5 % 50 mL IVPB  Status:  Discontinued     2 g 100 mL/hr over 30 Minutes Intravenous Every 24 hours 01/25/17 0917 01/26/17 0937   01/24/17 1800  levofloxacin (LEVAQUIN) IVPB 750 mg  Status:  Discontinued     750 mg 100 mL/hr over 90 Minutes Intravenous Every 24 hours 01/24/17 1658 01/25/17 0900       Subjective: Continues to have left knee pain and right wrist pain.  Range of motion slightly improved.  Denies shortness of breath, has had minimal cough.  Admits to fevers   Objective: Vitals:   01/25/17 1311 01/25/17 1700 01/25/17 2042 01/26/17 0406  BP: (!) 144/89 (!) 154/97 134/81 130/80  Pulse: (!) 114 Marland Kitchen(!)  114 (!) 101 98  Resp: 20 20 20 19   Temp: 100.1 F (37.8 C) (!) 102.3 F (39.1 C) 99.8 F (37.7 C) 99.1 F (37.3 C)  TempSrc: Oral Oral Oral Oral  SpO2: 95% 97% 98% 96%  Weight:      Height:        Intake/Output Summary (Last 24 hours) at 01/26/2017 1215 Last data filed at 01/26/2017 0200 Gross per 24 hour  Intake 1955 ml  Output 500 ml  Net 1455 ml   Filed Weights   01/24/17 1321  Weight: 101.2 kg (223 lb)    Examination:  General exam: Appears calm but uncomfortable  Respiratory  system: Clear to auscultation, crackles left base. Respiratory effort normal. Cardiovascular system: S1 & S2 heard, RRR. No JVD, murmurs, rubs, gallops or clicks. No pedal edema. Gastrointestinal system: Abdomen is nondistended, soft and nontender. No organomegaly or masses felt. Normal bowel sounds heard. Central nervous system: Alert and oriented. No focal neurological deficits. Extremities: +left knee effusion and right wrist effusion, warm to touch  Skin: No rashes, lesions or ulcers Psychiatry: Judgement and insight appear normal. Mood & affect appropriate.   Data Reviewed: I have personally reviewed following labs and imaging studies  CBC: Recent Labs  Lab 01/24/17 0855 01/25/17 0353 01/26/17 0359  WBC 17.4* 16.3* 16.6*  NEUTROABS 14.1*  --  13.9*  HGB 8.6* 8.1* 7.5*  HCT 24.9* 24.0* 22.0*  MCV 104.2* 104.3* 104.3*  PLT 284 254 234   Basic Metabolic Panel: Recent Labs  Lab 01/24/17 0855 01/24/17 1124 01/24/17 1425 01/25/17 0353 01/26/17 0359  NA 137  --  138 133* 136  K 2.4*  --  3.0* 3.3* 4.8  CL 89*  --  92* 92* 100*  CO2 30  --  31 28 25   GLUCOSE 167*  --  153* 130* 187*  BUN 18  --  16 21* 31*  CREATININE 1.06  --  0.92 1.15 1.59*  CALCIUM 7.0*  --  6.7* 6.5* 7.1*  MG  --  0.7* 0.8*  --  1.3*   GFR: Estimated Creatinine Clearance: 68.1 mL/min (A) (by C-G formula based on SCr of 1.59 mg/dL (H)). Liver Function Tests: Recent Labs  Lab 01/24/17 0855 01/26/17 0359  AST 49* 44*  ALT 26 22  ALKPHOS 101 92  BILITOT 2.0* 1.3*  PROT 7.4 6.5  ALBUMIN 3.4* 2.5*   No results for input(s): LIPASE, AMYLASE in the last 168 hours. No results for input(s): AMMONIA in the last 168 hours. Coagulation Profile: No results for input(s): INR, PROTIME in the last 168 hours. Cardiac Enzymes: No results for input(s): CKTOTAL, CKMB, CKMBINDEX, TROPONINI in the last 168 hours. BNP (last 3 results) No results for input(s): PROBNP in the last 8760 hours. HbA1C: No results  for input(s): HGBA1C in the last 72 hours. CBG: Recent Labs  Lab 01/24/17 1249  GLUCAP 134*   Lipid Profile: No results for input(s): CHOL, HDL, LDLCALC, TRIG, CHOLHDL, LDLDIRECT in the last 72 hours. Thyroid Function Tests: Recent Labs    01/26/17 0359  TSH 1.039   Anemia Panel: Recent Labs    01/26/17 0359  VITAMINB12 358   Sepsis Labs: No results for input(s): PROCALCITON, LATICACIDVEN in the last 168 hours.  Recent Results (from the past 240 hour(s))  Body fluid culture     Status: None (Preliminary result)   Collection Time: 01/24/17 12:32 PM  Result Value Ref Range Status   Specimen Description SYNOVIAL LEFT KNEE  Final  Special Requests NONE  Final   Gram Stain   Final    ABUNDANT WBC PRESENT, PREDOMINANTLY PMN NO ORGANISMS SEEN Gram Stain Report Called to,Read Back By and Verified With: HUFF, J. RN @1405  ON 12.26.18 BY NMCCOY    Culture   Final    NO GROWTH 2 DAYS Performed at [facility], [address] [place], [place] [address]    Report Status PENDING  Incomplete  Culture, blood (routine x 2)     Status: None (Preliminary result)   Collection Time: 01/24/17  3:18 PM  Result Value Ref Range Status   Specimen Description BLOOD LEFT HAND  Final   Special Requests   Final    BOTTLES DRAWN AEROBIC AND ANAEROBIC Blood Culture adequate volume   Culture   Final    NO GROWTH < 24 HOURS Performed at [facility], [address], [place], [place] [address]    Report Status PENDING  Incomplete  Culture, blood (routine x 2)     Status: None (Preliminary result)   Collection Time: 01/24/17  3:18 PM  Result Value Ref Range Status   Specimen Description BLOOD LEFT HAND  Final   Special Requests   Final    BOTTLES DRAWN AEROBIC AND ANAEROBIC Blood Culture adequate volume   Culture   Final    NO GROWTH < 24 HOURS Performed at [facility], [address], [place], [place] [address]    Report Status PENDING  Incomplete       Radiology  Studies: Dg Chest 2 View  Result Date: 01/26/2017 CLINICAL DATA:  Dyspnea and shortness of breath.  Generalized pain. EXAM: CHEST  2 VIEW COMPARISON:  01/24/2017 FINDINGS: Normal heart size. No pleural effusion or edema. Subtle asymmetric opacity within the left lower lobe is identified and may represent early pneumonia. Right lung appears clear. IMPRESSION: 1. Suspect early pneumonia within the left lower lobe. Electronically Signed   By: Signa Kell M.D.   On: 01/26/2017 09:08   Dg Chest 2 View  Result Date: 01/24/2017 CLINICAL DATA:  High fever. EXAM: CHEST  2 VIEW COMPARISON:  None. FINDINGS: Heart size is normal. Mediastinal shadows are normal. Abnormal density on the lateral view overlying the lower spine is consistent with lower lobe pneumonia. Side indeterminate based on the frontal view. No effusions. No acute bone finding. IMPRESSION: Lower lobe pneumonia visible on the lateral view, side indeterminate. Electronically Signed   By: Paulina Fusi M.D.   On: 01/24/2017 15:54   Dg Wrist 2 Views Right  Result Date: 01/25/2017 CLINICAL DATA:  Radial side swelling.  History of gout. EXAM: RIGHT WRIST - 2 VIEW COMPARISON:  None. FINDINGS: No evidence of fracture. No visible bone erosion. 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 IMPRESSION: Indistinct hyperdensity dorsal to the carpal bones, which could be consistent with gout tophus given the history. Electronically Signed   By: Paulina Fusi M.D.   On: 01/25/2017 10:20   Dg Knee Complete 4 Views Left  Result Date: 01/25/2017 CLINICAL DATA:  Pain and swelling of the left knee. EXAM: LEFT KNEE - COMPLETE 4+ VIEW COMPARISON:  None. FINDINGS: Large knee joint effusion. Previous tunneled ACL repair. Hardware appears unremarkable. Degenerative arthritis with joint space narrowing and osteophytes, worse in the medial compartment. Patellofemoral osteoarthritis as well. IMPRESSION: Large knee joint  effusion. Previous tunneled ACL repair. Osteoarthritis, more advanced in the medial compartment and patellofemoral joint and lateral compartment.  Electronically Signed   By: Paulina Fusi M.D.   On: 01/25/2017 10:19      Scheduled Meds: . allopurinol  100 mg Oral Daily  . amLODipine  10 mg Oral Daily  . azithromycin  500 mg Oral Daily  . colchicine  0.6 mg Oral Once   Followed by  . colchicine  0.6 mg Oral Daily  . hydrALAZINE  50 mg Oral TID  . pantoprazole  40 mg Oral Daily  . predniSONE  60 mg Oral Q breakfast   Continuous Infusions: . sodium chloride 100 mL/hr at 01/26/17 0930  . cefTRIAXone (ROCEPHIN)  IV 1 g (01/26/17 1107)     LOS: 0 days    Time spent: 40 minutes   Noralee Stain, DO [facility] [email or web address] Password [facility] 01/26/2017, 12:15 PM

## 2017-01-27 ENCOUNTER — Inpatient Hospital Stay (HOSPITAL_COMMUNITY): Payer: 59

## 2017-01-27 LAB — BASIC METABOLIC PANEL
Anion gap: 9 (ref 5–15)
BUN: 40 mg/dL — AB (ref 6–20)
CALCIUM: 8 mg/dL — AB (ref 8.9–10.3)
CO2: 25 mmol/L (ref 22–32)
CREATININE: 1.72 mg/dL — AB (ref 0.61–1.24)
Chloride: 105 mmol/L (ref 101–111)
GFR calc Af Amer: 52 mL/min — ABNORMAL LOW (ref >60)
GFR calc non Af Amer: 45 mL/min — ABNORMAL LOW (ref >60)
GLUCOSE: 159 mg/dL — AB (ref 65–99)
Potassium: 4.2 mmol/L (ref 3.5–5.1)
Sodium: 139 mmol/L (ref 135–145)

## 2017-01-27 LAB — BODY FLUID CULTURE: CULTURE: NO GROWTH

## 2017-01-27 LAB — CBC
HEMATOCRIT: 21 % — AB (ref 39.0–52.0)
Hemoglobin: 7.2 g/dL — ABNORMAL LOW (ref 13.0–17.0)
MCH: 35.3 pg — ABNORMAL HIGH (ref 26.0–34.0)
MCHC: 34.3 g/dL (ref 30.0–36.0)
MCV: 102.9 fL — AB (ref 78.0–100.0)
PLATELETS: 287 10*3/uL (ref 150–400)
RBC: 2.04 MIL/uL — ABNORMAL LOW (ref 4.22–5.81)
RDW: 21.7 % — AB (ref 11.5–15.5)
WBC: 17.1 10*3/uL — ABNORMAL HIGH (ref 4.0–10.5)

## 2017-01-27 LAB — MAGNESIUM: Magnesium: 1.8 mg/dL (ref 1.7–2.4)

## 2017-01-27 MED ORDER — CELECOXIB 200 MG PO CAPS
200.0000 mg | ORAL_CAPSULE | Freq: Every day | ORAL | Status: DC
  Administered 2017-01-27: 200 mg via ORAL
  Filled 2017-01-27: qty 1

## 2017-01-27 MED ORDER — CEFPODOXIME PROXETIL 200 MG PO TABS
200.0000 mg | ORAL_TABLET | Freq: Two times a day (BID) | ORAL | Status: DC
  Administered 2017-01-27 – 2017-01-31 (×9): 200 mg via ORAL
  Filled 2017-01-27 (×9): qty 1

## 2017-01-27 NOTE — Progress Notes (Signed)
PROGRESS NOTE    [name]  WUJ:[id number] DOB: 1966/01/26 DOA: 01/24/2017 PCP: Patient, No Pcp Per     Brief Narrative:  Scott [name] is a  51 year old male with history of recent hospitalization at at an outside facility with GI bleed secondary to gastritis from NSAID use for gout requiring blood transfusions presented to [facility] on 01/24/2017 with complaints of left knee pain. Patient had left knee arthrocentesis done in the ED to rule out septic arthritis. He was started on oral colchicine and oral prednisone for probable gout flareup.  Assessment & Plan:   Principal Problem:   Gout attack Active Problems:   Hypokalemia   Gastritis   h/o recent GIB (gastrointestinal bleeding)   Hypomagnesemia   Gout flare -Left knee arthrocentesis revealed synovial fluid white count 61,500 with 97% neutrophils along with intracellular monosodium urate crystals.  Culture is pending, no growth 2 days -Orthopedic surgery consulted -Currently on systemic prednisone  Sepsis secondary to left lower lobar pneumonia -Change antibiotics to vantin and azithromycin -Fever 102.3, tachycardia, tachypnea and leukocytosis.  These could also be reactive secondary to gout flare.  Chest x-ray revealed developing left lobe pneumonia. Afebrile now.  -Strep pneumo, legionella negative   AKI  -Cr 1.15 --> 1.59 --> 1.72  -IVF started. Stop vanco.  -Check renal US. UOP adequate.   Macrocytic anemia -Recent admission for GI bleed and had transfusions  Gastritis with recent GI bleeding -Hemoglobin stable, continue Protonix   DVT prophylaxis: SCD Code Status: Full Family Communication: No family at bedside Disposition Plan: Pending improvement. PT OT    Consultants:   Orthopedic surgery   Procedures:   Left knee arthrocentesis   Antimicrobials:  Anti-infectives (From admission, onward)   Start     Dose/Rate Route Frequency Ordered Stop   01/27/17 1000  cefpodoxime (VANTIN) tablet  200 mg     200 mg Oral Every 12 hours 01/27/17 0946     01/26/17 1000  cefTRIAXone (ROCEPHIN) 1 g in dextrose 5 % 50 mL IVPB  Status:  Discontinued     1 g 100 mL/hr over 30 Minutes Intravenous Every 24 hours 01/26/17 0937 01/27/17 0946   01/26/17 1000  azithromycin (ZITHROMAX) tablet 500 mg     500 mg Oral Daily 01/26/17 0937     01/25/17 2200  vancomycin (VANCOCIN) IVPB 1000 mg/200 mL premix  Status:  Discontinued     1,000 mg 200 mL/hr over 60 Minutes Intravenous Every 12 hours 01/25/17 1112 01/26/17 0531   01/25/17 2000  vancomycin (VANCOCIN) 1,250 mg in sodium chloride 0.9 % 250 mL IVPB  Status:  Discontinued     1,250 mg 166.7 mL/hr over 90 Minutes Intravenous Every 8 hours 01/25/17 0933 01/25/17 1112   01/25/17 1100  vancomycin (VANCOCIN) 2,000 mg in sodium chloride 0.9 % 500 mL IVPB     2,000 mg 250 mL/hr over 120 Minutes Intravenous  Once 01/25/17 0933 01/25/17 1523   01/25/17 1000  cefTRIAXone (ROCEPHIN) 2 g in dextrose 5 % 50 mL IVPB  Status:  Discontinued     2 g 100 mL/hr over 30 Minutes Intravenous Every 24 hours 01/25/17 0917 01/26/17 0937   01/24/17 1800  levofloxacin (LEVAQUIN) IVPB 750 mg  Status:  Discontinued     750 mg 100 mL/hr over 90 Minutes Intravenous Every 24 hours 01/24/17 1658 01/25/17 0900       Subjective: Slightly improved. No acute events or new complaints.    Objective: Vitals:   01/26/17 1403 01/26/17  2122 01/27/17 0447 01/27/17 0957  BP: 122/82 (!) 141/81 135/84 (!) 147/90  Pulse: 93 (!) 104 93 93  Resp:  16 16   Temp: 99.1 F (37.3 C) 99.2 F (37.3 C) 98.2 F (36.8 C) 98.4 F (36.9 C)  TempSrc: Oral Oral Oral Oral  SpO2: 95% 98% 93% 98%  Weight:      Height:        Intake/Output Summary (Last 24 hours) at 01/27/2017 1241 Last data filed at 01/27/2017 1039 Gross per 24 hour  Intake 2460 ml  Output 1300 ml  Net 1160 ml   Filed Weights   01/24/17 1321  Weight: 101.2 kg (223 lb)    Examination:  General exam: Appears calm  and comfortable  Respiratory system: Clear to auscultation, crackles left base. Respiratory effort normal. Cardiovascular system: S1 & S2 heard, RRR. No JVD, murmurs, rubs, gallops or clicks. No pedal edema. Gastrointestinal system: Abdomen is nondistended, soft and nontender. No organomegaly or masses felt. Normal bowel sounds heard. Central nervous system: Alert and oriented. No focal neurological deficits. Extremities: +left knee effusion and right wrist effusion, warm to touch, no surrounding erythema  Skin: No rashes, lesions or ulcers Psychiatry: Judgement and insight appear normal. Mood & affect appropriate.   Data Reviewed: I have personally reviewed following labs and imaging studies  CBC: Recent Labs  Lab 01/24/17 0855 01/25/17 0353 01/26/17 0359 01/27/17 0426  WBC 17.4* 16.3* 16.6* 17.1*  NEUTROABS 14.1*  --  13.9*  --   HGB 8.6* 8.1* 7.5* 7.2*  HCT 24.9* 24.0* 22.0* 21.0*  MCV 104.2* 104.3* 104.3* 102.9*  PLT 284 254 234 287   Basic Metabolic Panel: Recent Labs  Lab 01/24/17 0855 01/24/17 1124 01/24/17 1425 01/25/17 0353 01/26/17 0359 01/27/17 0426  NA 137  --  138 133* 136 139  K 2.4*  --  3.0* 3.3* 4.8 4.2  CL 89*  --  92* 92* 100* 105  CO2 30  --  31 28 25 25   GLUCOSE 167*  --  153* 130* 187* 159*  BUN 18  --  16 21* 31* 40*  CREATININE 1.06  --  0.92 1.15 1.59* 1.72*  CALCIUM 7.0*  --  6.7* 6.5* 7.1* 8.0*  MG  --  0.7* 0.8*  --  1.3* 1.8   GFR: Estimated Creatinine Clearance: 63 mL/min (A) (by C-G formula based on SCr of 1.72 mg/dL (H)). Liver Function Tests: Recent Labs  Lab 01/24/17 0855 01/26/17 0359  AST 49* 44*  ALT 26 22  ALKPHOS 101 92  BILITOT 2.0* 1.3*  PROT 7.4 6.5  ALBUMIN 3.4* 2.5*   No results for input(s): LIPASE, AMYLASE in the last 168 hours. No results for input(s): AMMONIA in the last 168 hours. Coagulation Profile: No results for input(s): INR, PROTIME in the last 168 hours. Cardiac Enzymes: No results for input(s):  CKTOTAL, CKMB, CKMBINDEX, TROPONINI in the last 168 hours. BNP (last 3 results) No results for input(s): PROBNP in the last 8760 hours. HbA1C: No results for input(s): HGBA1C in the last 72 hours. CBG: Recent Labs  Lab 01/24/17 1249  GLUCAP 134*   Lipid Profile: No results for input(s): CHOL, HDL, LDLCALC, TRIG, CHOLHDL, LDLDIRECT in the last 72 hours. Thyroid Function Tests: Recent Labs    01/26/17 0359  TSH 1.039   Anemia Panel: Recent Labs    01/26/17 0359  VITAMINB12 358   Sepsis Labs: No results for input(s): PROCALCITON, LATICACIDVEN in the last 168 hours.  Recent Results (from  the past 240 hour(s))  Body fluid culture     Status: None   Collection Time: 01/24/17 12:32 PM  Result Value Ref Range Status   Specimen Description SYNOVIAL LEFT KNEE  Final   Special Requests NONE  Final   Gram Stain   Final    ABUNDANT WBC PRESENT, PREDOMINANTLY PMN NO ORGANISMS SEEN Gram Stain Report Called to,Read Back By and Verified With: HUFF, J. RN @1405  ON 12.26.18 BY NMCCOY    Culture   Final    NO GROWTH 3 DAYS Performed at [facility], [address] [place], [place] [address]    Report Status 01/27/2017 FINAL  Final  Culture, blood (routine x 2)     Status: None (Preliminary result)   Collection Time: 01/24/17  3:18 PM  Result Value Ref Range Status   Specimen Description BLOOD LEFT HAND  Final   Special Requests   Final    BOTTLES DRAWN AEROBIC AND ANAEROBIC Blood Culture adequate volume   Culture   Final    NO GROWTH 3 DAYS Performed at [facility], [address] [place], [place] [address]    Report Status PENDING  Incomplete  Culture, blood (routine x 2)     Status: None (Preliminary result)   Collection Time: 01/24/17  3:18 PM  Result Value Ref Range Status   Specimen Description BLOOD LEFT HAND  Final   Special Requests   Final    BOTTLES DRAWN AEROBIC AND ANAEROBIC Blood Culture adequate volume   Culture   Final    NO GROWTH 3  DAYS Performed at [facility], [address] [place], [place] [address]    Report Status PENDING  Incomplete       Radiology Studies: Dg Chest 2 View  Result Date: 01/26/2017 CLINICAL DATA:  Dyspnea and shortness of breath.  Generalized pain. EXAM: CHEST  2 VIEW COMPARISON:  01/24/2017 FINDINGS: Normal heart size. No pleural effusion or edema. Subtle asymmetric opacity within the left lower lobe is identified and may represent early pneumonia. Right lung appears clear. IMPRESSION: 1. Suspect early pneumonia within the left lower lobe. Electronically Signed   By: Signa Kellaylor  Stroud M.D.   On: 01/26/2017 09:08      Scheduled Meds: . allopurinol  100 mg Oral Daily  . amLODipine  10 mg Oral Daily  . azithromycin  500 mg Oral Daily  . cefpodoxime  200 mg Oral Q12H  . hydrALAZINE  50 mg Oral TID  . pantoprazole  40 mg Oral Daily  . predniSONE  60 mg Oral Q breakfast   Continuous Infusions: . sodium chloride 100 mL/hr at 01/27/17 0659     LOS: 1 day    Time spent: 30 minutes   Noralee StainJennifer Dahlia Nifong, DO [facility] [email or web address] Password [email or web address] 01/27/2017, 12:41 PM

## 2017-01-27 NOTE — Progress Notes (Signed)
Subjective:    Patient reports pain as mild to knee.  Tolerating PO's. Denies F/c , Calf pain, cp, or sob.  Objective: Vital signs in last 24 hours: Temp:  [98.2 F (36.8 C)-99.2 F (37.3 C)] 98.2 F (36.8 C) (12/29 0447) Pulse Rate:  [93-104] 93 (12/29 0447) Resp:  [16] 16 (12/29 0447) BP: (122-141)/(81-84) 135/84 (12/29 0447) SpO2:  [93 %-98 %] 93 % (12/29 0447)  Intake/Output from previous day: 12/28 0701 - 12/29 0700 In: 2660 [P.O.:560; I.V.:2050; IV Piggyback:50] Out: 1100 [Urine:1100] Intake/Output this shift: No intake/output data recorded.  Recent Labs    01/24/17 0855 01/25/17 0353 01/26/17 0359 01/27/17 0426  HGB 8.6* 8.1* 7.5* 7.2*   Recent Labs    01/26/17 0359 01/27/17 0426  WBC 16.6* 17.1*  RBC 2.11* 2.04*  HCT 22.0* 21.0*  PLT 234 287   Recent Labs    01/26/17 0359 01/27/17 0426  NA 136 139  K 4.8 4.2  CL 100* 105  CO2 25 25  BUN 31* 40*  CREATININE 1.59* 1.72*  GLUCOSE 187* 159*  CALCIUM 7.1* 8.0*   No results for input(s): LABPT, INR in the last 72 hours.  Alert and oriented x3. RRR, Lungs clear, BS x4. Left Calf soft and non tender. L knee minimal effusion. No DVT signs. No signs of infection or compartment syndrome. LLE grossly neurovascularly intact.   Assessment/Plan:   Left knee Gout: Up with PT Monitor Cultures Add Celebrex D/c when ready  Laurissa Cowper L 01/27/2017, 7:51 AM

## 2017-01-28 ENCOUNTER — Inpatient Hospital Stay (HOSPITAL_COMMUNITY): Payer: 59

## 2017-01-28 LAB — CBC
HEMATOCRIT: 23.4 % — AB (ref 39.0–52.0)
HEMOGLOBIN: 7.7 g/dL — AB (ref 13.0–17.0)
MCH: 34.1 pg — AB (ref 26.0–34.0)
MCHC: 32.9 g/dL (ref 30.0–36.0)
MCV: 103.5 fL — AB (ref 78.0–100.0)
Platelets: 348 10*3/uL (ref 150–400)
RBC: 2.26 MIL/uL — ABNORMAL LOW (ref 4.22–5.81)
RDW: 22.1 % — ABNORMAL HIGH (ref 11.5–15.5)
WBC: 14 10*3/uL — ABNORMAL HIGH (ref 4.0–10.5)

## 2017-01-28 LAB — BASIC METABOLIC PANEL
Anion gap: 8 (ref 5–15)
BUN: 34 mg/dL — AB (ref 6–20)
CHLORIDE: 107 mmol/L (ref 101–111)
CO2: 25 mmol/L (ref 22–32)
CREATININE: 1.49 mg/dL — AB (ref 0.61–1.24)
Calcium: 8.6 mg/dL — ABNORMAL LOW (ref 8.9–10.3)
GFR calc Af Amer: >60 mL/min (ref >60)
GFR calc non Af Amer: 53 mL/min — ABNORMAL LOW (ref >60)
Glucose, Bld: 150 mg/dL — ABNORMAL HIGH (ref 65–99)
Potassium: 4 mmol/L (ref 3.5–5.1)
Sodium: 140 mmol/L (ref 135–145)

## 2017-01-28 MED ORDER — HYDRALAZINE HCL 20 MG/ML IJ SOLN
5.0000 mg | INTRAMUSCULAR | Status: DC | PRN
  Administered 2017-01-28 – 2017-01-30 (×3): 5 mg via INTRAVENOUS
  Filled 2017-01-28 (×3): qty 1

## 2017-01-28 NOTE — Progress Notes (Signed)
LCSW consulted for disposition/SNF?  LCSW reviewed chart, recommendations reflect HHPT but not safe to DC home due to Pt currently requires +2 A for transfers to chair.  Once pain is more manageable then feel home with HHPT services is appropriate, but at this time he is not safe to d/c home.  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  If does not progress in a reasonable time frame, will need to consider other options such as CIR or SNF.  Patient has [employer] insurance which requires pre-auth with recommendations for SNF clearly reflected in order to SNF benefit for ST.   Discussed with MD to see if PT could come back and re-evaluate and work on pain control in effort to understand home vs SNF.  Will follow acutely and assist with dc needs.  With current recommendations as stated, it is very unlikely he would receive authorization for SNF.  Deretha EmoryHannah Jaxtyn Linville LCSW, MSW Clinical Social Work: Optician, dispensingystem [employer] Coverage for :  [phone]

## 2017-01-28 NOTE — Evaluation (Signed)
Physical Therapy Evaluation Patient Details Name: Scott [name] MRN: [id number] DOB: 09/19/1965 Today's Date: 01/28/2017   History of Present Illness  51 year old male with history of recent hospitalization at at an outside facility with GI bleed secondary to gastritis from NSAID use for gout requiring blood transfusions presented [facility] on 01/24/2017 with complaints of left knee pain. Pt being treated for gout.  Clinical Impression  Pt admitted with above diagnosis. Pt currently with functional limitations due to the deficits listed below (see PT Problem List).  Pt will benefit from skilled PT to increase their independence and safety with mobility to allow discharge to the venue listed below.  Pt currently requires +2 A for transfers to chair.  Once pain is more manageable then feel home with HHPT services is appropriate, but at this time he is not safe to d/c home.  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  If does not progress in a reasonable time frame, will need to consider other options such as CIR or SNF.     Follow Up Recommendations Home health PT (if he progresses from eval)    Equipment Recommendations  Rolling walker with 5" wheels;3in1 (PT)    Recommendations for Other Services       Precautions / Restrictions Precautions Precautions: Fall Restrictions Weight Bearing Restrictions: No      Mobility  Bed Mobility Overal bed mobility: Needs Assistance Bed Mobility: Supine to Sit     Supine to sit: HOB elevated;Min guard     General bed mobility comments: Pt able to come to edge of bed with head of bed elevated and with use of rail.  Difficulty scooting to edge due to not using R UE.  Transfers Overall transfer level: Needs assistance Equipment used: Rolling walker (2 wheeled) Transfers: Sit to/from [employer] CorporationStand;Stand Pivot Transfers Sit to Stand: Max assist;From elevated surface;+2 physical assistance Stand pivot transfers: Max  assist;+2 physical assistance;From elevated surface       General transfer comment: Stood to RW with slow transition and MAX of 2.  Keeping forearms on RW [facility]-modal [facility].  After using commode, it required 3 people to A him with 2 standing with him, while one cleaned him up from BM.  BSC then moved and recliner brought behind him.  Ambulation/Gait             General Gait Details: unable  Stairs            Wheelchair Mobility    Modified Rankin (Stroke Patients Only)       Balance Overall balance assessment: Needs assistance   Sitting balance-Leahy Scale: Fair       Standing balance-Leahy Scale: Zero Standing balance comment: MAX A with RW due to pain and weakness                             Pertinent Vitals/Pain Pain Assessment: 0-10 Pain Score: 8  Pain Location: 8/10 on L LE 5/10 on R L LE and unable to state regarding R UE Pain Descriptors / Indicators: Heaviness;Other (Comment)(swollen) Pain Intervention(s): RN gave pain meds during session;Limited activity within patient's tolerance    Home Living Family/patient expects to be discharged to:: Private residence Living Arrangements: Spouse/significant other Available Help at Discharge: Family;Available PRN/intermittently Type of Home: House Home Access: Stairs to enter Entrance Stairs-Rails: Right;Left;Can reach both Entrance Stairs-Number of Steps: 3 Home  Layout: Bed/bath upstairs;Able to live on main level with bedroom/bathroom;1/2 bath on main level Home Equipment: Cane - single point      Prior Function Level of Independence: Independent         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 RUE: Unable to fully assess due to  pain    Lower Extremity Assessment Lower Extremity Assessment: RLE deficits/detail;LLE deficits/detail RLE Deficits / Details: swollen with limited ROM RLE: Unable to fully assess due to pain LLE Deficits / Details: swollen with limited ROM LLE: Unable to fully assess due to pain       Communication   Communication: No difficulties  Cognition Arousal/Alertness: Awake/alert Behavior During Therapy: WFL for tasks assessed/performed Overall Cognitive Status: Within Functional Limits for tasks assessed                                        General Comments General comments (skin integrity, edema, etc.): LE swollen, but R UE appears the most swollen.  pt did get tearful during session about current medical state.    Exercises     Assessment/Plan    PT Assessment Patient needs continued PT services  PT Problem List Decreased strength;Decreased range of motion;Decreased balance;Decreased mobility       PT Treatment Interventions DME instruction;Gait training;Functional mobility training;Therapeutic activities;Therapeutic exercise;Balance training    PT Goals (Current goals can be found in the Care Plan section)  Acute Rehab PT Goals Patient Stated Goal: home PT Goal Formulation: With patient Time For Goal Achievement: 02/11/17 Potential to Achieve Goals: Good    Frequency Min 3X/week   Barriers to discharge Inaccessible home environment      Co-evaluation               AM-PAC PT "6 Clicks" Daily Activity  Outcome Measure Difficulty turning over in bed (including adjusting bedclothes, sheets and blankets)?: A Little Difficulty moving from lying on back to sitting on the side of the bed? : A Little Difficulty sitting down on and standing up from a chair with arms (e.g., wheelchair, bedside commode, etc,.)?: Unable Help needed moving to and from a bed to chair (including a wheelchair)?: A Lot Help needed walking in hospital room?: Total Help  needed climbing 3-5 steps with a railing? : Total 6 Click Score: 11    End of Session Equipment Utilized During Treatment: Gait belt Activity Tolerance: Patient limited by pain Patient left: in chair;with nursing/sitter in room Nurse Communication: Mobility status;Need for lift equipment PT Visit Diagnosis: Unsteadiness on feet (R26.81);Muscle weakness (generalized) (M62.81);Difficulty in walking, not elsewhere classified (R26.2)    Time: 1144(no charge for time on commode)-1225 PT Time Calculation (min) (ACUTE ONLY): 41 min   Charges:   PT Evaluation $PT Eval Moderate Complexity: 1 Mod PT Treatments $Therapeutic Activity: 8-22 mins   PT G Codes:        Scott [name], [place] Pager [phone] 01/28/2017   Scott [name] 01/28/2017, 1:00 PM

## 2017-01-28 NOTE — Progress Notes (Signed)
PROGRESS NOTE    Eann Cleland  ZOX:[id number] DOB: May 19, 1965 DOA: 01/24/2017 PCP: Patient, No Pcp Per     Brief Narrative:  Randee Upchurch is a 51 year old male with history of recent hospitalization at at an outside facility with GI bleed secondary to gastritis from NSAID use for gout requiring blood transfusions presented to [facility] on 01/24/2017 with complaints of left knee pain. Patient had left knee arthrocentesis done in the ED to rule out septic arthritis. He was started on oral colchicine and oral prednisone for gout flareup.  Assessment & Plan:   Principal Problem:   Gout attack Active Problems:   Hypokalemia   Gastritis   h/o recent GIB (gastrointestinal bleeding)   Hypomagnesemia   Gout flare -Left knee arthrocentesis revealed synovial fluid white count 61,500 with 97% neutrophils along with intracellular monosodium urate crystals. Culture negative.  -Orthopedic surgery consulted -Currently on systemic prednisone  Sepsis secondary to left lower lobar pneumonia -Fever 102.3, tachycardia, tachypnea and leukocytosis.  These could also be reactive secondary to gout flare.  Chest x-ray revealed developing left lobe pneumonia. Afebrile now.  -Strep pneumo, legionella negative  -Continue vantin and azithromycin  AKI  -Cr 1.15 --> 1.59 --> 1.72 --> 1.49. Likely related to medications such as colchicine and vanco.  -Renal US unremarkable   Macrocytic anemia -Recent admission for GI bleed and had transfusions -Hemoglobin stable  Gastritis with recent GI bleeding -Hemoglobin stable, continue Protonix   DVT prophylaxis: SCD Code Status: Full Family Communication: No family at bedside Disposition Plan: Pending improvement. PT OT. SNF    Consultants:   Orthopedic surgery   Procedures:   Left knee arthrocentesis   Antimicrobials:  Anti-infectives (From admission, onward)   Start     Dose/Rate Route Frequency Ordered Stop   01/27/17 1000  cefpodoxime  (VANTIN) tablet 200 mg     200 mg Oral Every 12 hours 01/27/17 0946     01/26/17 1000  cefTRIAXone (ROCEPHIN) 1 g in dextrose 5 % 50 mL IVPB  Status:  Discontinued     1 g 100 mL/hr over 30 Minutes Intravenous Every 24 hours 01/26/17 0937 01/27/17 0946   01/26/17 1000  azithromycin (ZITHROMAX) tablet 500 mg     500 mg Oral Daily 01/26/17 0937     01/25/17 2200  vancomycin (VANCOCIN) IVPB 1000 mg/200 mL premix  Status:  Discontinued     1,000 mg 200 mL/hr over 60 Minutes Intravenous Every 12 hours 01/25/17 1112 01/26/17 0531   01/25/17 2000  vancomycin (VANCOCIN) 1,250 mg in sodium chloride 0.9 % 250 mL IVPB  Status:  Discontinued     1,250 mg 166.7 mL/hr over 90 Minutes Intravenous Every 8 hours 01/25/17 0933 01/25/17 1112   01/25/17 1100  vancomycin (VANCOCIN) 2,000 mg in sodium chloride 0.9 % 500 mL IVPB     2,000 mg 250 mL/hr over 120 Minutes Intravenous  Once 01/25/17 0933 01/25/17 1523   01/25/17 1000  cefTRIAXone (ROCEPHIN) 2 g in dextrose 5 % 50 mL IVPB  Status:  Discontinued     2 g 100 mL/hr over 30 Minutes Intravenous Every 24 hours 01/25/17 0917 01/26/17 0937   01/24/17 1800  levofloxacin (LEVAQUIN) IVPB 750 mg  Status:  Discontinued     750 mg 100 mL/hr over 90 Minutes Intravenous Every 24 hours 01/24/17 1658 01/25/17 0900       Subjective: Continues use to have pain in his left knee and right wrist.  Complaining of swelling in his right arm.  Objective: Vitals:   01/27/17 1329 01/27/17 2007 01/28/17 0407 01/28/17 0910  BP: (!) 159/101 (!) 155/96 (!) 178/98 (!) 155/105  Pulse: (!) 116 (!) 101 93   Resp:  18 16   Temp: 98.4 F (36.9 C) 98.9 F (37.2 C) 98.7 F (37.1 C)   TempSrc: Oral Oral Oral   SpO2: 100% 100% 98%   Weight:      Height:        Intake/Output Summary (Last 24 hours) at 01/28/2017 1359 Last data filed at 01/28/2017 1228 Gross per 24 hour  Intake 3308.33 ml  Output 1950 ml  Net 1358.33 ml   Filed Weights   01/24/17 1321  Weight: 101.2  kg (223 lb)    Examination:  General exam: Appears calm and comfortable  Respiratory system: Clear to auscultation, crackles left base. Respiratory effort normal. Cardiovascular system: S1 & S2 heard, RRR. No JVD, murmurs, rubs, gallops or clicks. No pedal edema. Gastrointestinal system: Abdomen is nondistended, soft and nontender. No organomegaly or masses felt. Normal bowel sounds heard. Central nervous system: Alert and oriented. No focal neurological deficits. Extremities: +left knee effusion and right wrist effusion, warm to touch, no surrounding erythema  Skin: No rashes, lesions or ulcers Psychiatry: Judgement and insight appear normal. Mood & affect appropriate.   Data Reviewed: I have personally reviewed following labs and imaging studies  CBC: Recent Labs  Lab 01/24/17 0855 01/25/17 0353 01/26/17 0359 01/27/17 0426 01/28/17 0343  WBC 17.4* 16.3* 16.6* 17.1* 14.0*  NEUTROABS 14.1*  --  13.9*  --   --   HGB 8.6* 8.1* 7.5* 7.2* 7.7*  HCT 24.9* 24.0* 22.0* 21.0* 23.4*  MCV 104.2* 104.3* 104.3* 102.9* 103.5*  PLT 284 254 234 287 348   Basic Metabolic Panel: Recent Labs  Lab 01/24/17 1124 01/24/17 1425 01/25/17 0353 01/26/17 0359 01/27/17 0426 01/28/17 0343  NA  --  138 133* 136 139 140  K  --  3.0* 3.3* 4.8 4.2 4.0  CL  --  92* 92* 100* 105 107  CO2  --  31 28 25 25 25   GLUCOSE  --  153* 130* 187* 159* 150*  BUN  --  16 21* 31* 40* 34*  CREATININE  --  0.92 1.15 1.59* 1.72* 1.49*  CALCIUM  --  6.7* 6.5* 7.1* 8.0* 8.6*  MG 0.7* 0.8*  --  1.3* 1.8  --    GFR: Estimated Creatinine Clearance: 72.7 mL/min (A) (by C-G formula based on SCr of 1.49 mg/dL (H)). Liver Function Tests: Recent Labs  Lab 01/24/17 0855 01/26/17 0359  AST 49* 44*  ALT 26 22  ALKPHOS 101 92  BILITOT 2.0* 1.3*  PROT 7.4 6.5  ALBUMIN 3.4* 2.5*   No results for input(s): LIPASE, AMYLASE in the last 168 hours. No results for input(s): AMMONIA in the last 168 hours. Coagulation  Profile: No results for input(s): INR, PROTIME in the last 168 hours. Cardiac Enzymes: No results for input(s): CKTOTAL, CKMB, CKMBINDEX, TROPONINI in the last 168 hours. BNP (last 3 results) No results for input(s): PROBNP in the last 8760 hours. HbA1C: No results for input(s): HGBA1C in the last 72 hours. CBG: Recent Labs  Lab 01/24/17 1249  GLUCAP 134*   Lipid Profile: No results for input(s): CHOL, HDL, LDLCALC, TRIG, CHOLHDL, LDLDIRECT in the last 72 hours. Thyroid Function Tests: Recent Labs    01/26/17 0359  TSH 1.039   Anemia Panel: Recent Labs    01/26/17 0359  VITAMINB12 358  Sepsis Labs: No results for input(s): PROCALCITON, LATICACIDVEN in the last 168 hours.  Recent Results (from the past 240 hour(s))  Body fluid culture     Status: None   Collection Time: 01/24/17 12:32 PM  Result Value Ref Range Status   Specimen Description SYNOVIAL LEFT KNEE  Final   Special Requests NONE  Final   Gram Stain   Final    ABUNDANT WBC PRESENT, PREDOMINANTLY PMN NO ORGANISMS SEEN Gram Stain Report Called to,Read Back By and Verified With: HUFF, J. RN @1405  ON 12.26.18 BY [email or web address]    Culture   Final    NO GROWTH 3 DAYS Performed at [facility], [address] [place], [place] [address]    Report Status 01/27/2017 FINAL  Final  Culture, blood (routine x 2)     Status: None (Preliminary result)   Collection Time: 01/24/17  3:18 PM  Result Value Ref Range Status   Specimen Description BLOOD LEFT HAND  Final   Special Requests   Final    BOTTLES DRAWN AEROBIC AND ANAEROBIC Blood Culture adequate volume   Culture   Final    NO GROWTH 3 DAYS Performed at [facility], [address] [place], [place] [address]    Report Status PENDING  Incomplete  Culture, blood (routine x 2)     Status: None (Preliminary result)   Collection Time: 01/24/17  3:18 PM  Result Value Ref Range Status   Specimen Description BLOOD LEFT HAND  Final   Special Requests   Final      BOTTLES DRAWN AEROBIC AND ANAEROBIC Blood Culture adequate volume   Culture   Final    NO GROWTH 3 DAYS Performed at [facility], [address] [place], [place] [address]    Report Status PENDING  Incomplete       Radiology Studies: [place]as Renal  Result Date: 01/28/2017 CLINICAL DATA:  Acute kidney injury. EXAM: RENAL / URINARY TRACT ULTRASOUND COMPLETE COMPARISON:  None. FINDINGS: Right Kidney: Length: 11.1 cm. Echogenicity within normal limits. No mass or hydronephrosis visualized. Left Kidney: Length: 11.4 cm. Echogenicity within normal limits. No mass or hydronephrosis visualized. Bladder: Appears normal for degree of bladder distention. IMPRESSION: Normal renal ultrasound. Electronically Signed   By: Obie DredgeWilliam T Derry M.D.   On: 01/28/2017 10:05      Scheduled Meds: . allopurinol  100 mg Oral Daily  . amLODipine  10 mg Oral Daily  . azithromycin  500 mg Oral Daily  . cefpodoxime  200 mg Oral Q12H  . hydrALAZINE  50 mg Oral TID  . pantoprazole  40 mg Oral Daily  . predniSONE  60 mg Oral Q breakfast   Continuous Infusions: . sodium chloride 100 mL/hr at 01/28/17 1153     LOS: 2 days    Time spent: 30 minutes   Noralee StainJennifer Redford Behrle, DO [facility] [email or web address] Password [email or web address] 01/28/2017, 1:59 PM

## 2017-01-28 NOTE — Progress Notes (Addendum)
Subjective:    Patient reports pain as 3 on 0-10 scale.    Objective: Vital signs in last 24 hours: Temp:  [98.4 F (36.9 C)-98.9 F (37.2 C)] 98.7 F (37.1 C) (12/30 0407) Pulse Rate:  [93-116] 93 (12/30 0407) Resp:  [16-18] 16 (12/30 0407) BP: (147-178)/(90-101) 178/98 (12/30 0407) SpO2:  [98 %-100 %] 98 % (12/30 0407)  Intake/Output from previous day: 12/29 0701 - 12/30 0700 In: 3120 [P.O.:720; I.V.:2400] Out: 1350 [Urine:1350] Intake/Output this shift: No intake/output data recorded.  Recent Labs    01/26/17 0359 01/27/17 0426 01/28/17 0343  HGB 7.5* 7.2* 7.7*   Recent Labs    01/27/17 0426 01/28/17 0343  WBC 17.1* 14.0*  RBC 2.04* 2.26*  HCT 21.0* 23.4*  PLT 287 348   Recent Labs    01/27/17 0426 01/28/17 0343  NA 139 140  K 4.2 4.0  CL 105 107  CO2 25 25  BUN 40* 34*  CREATININE 1.72* 1.49*  GLUCOSE 159* 150*  CALCIUM 8.0* 8.6*   No results for input(s): LABPT, INR in the last 72 hours.  Positive crystals  Neurologically intact Sensation intact distally Intact pulses distally Compartment soft Mild effusion.  Assessment/Plan:   Knee aspirate culture negative Up with therapy. Continue Gout treatment. WBC decreased. Cr better.   Faigy Stretch C 01/28/2017, 8:34 AM

## 2017-01-29 ENCOUNTER — Inpatient Hospital Stay (HOSPITAL_COMMUNITY): Payer: 59

## 2017-01-29 DIAGNOSIS — D649 Anemia, unspecified: Secondary | ICD-10-CM

## 2017-01-29 DIAGNOSIS — M7989 Other specified soft tissue disorders: Secondary | ICD-10-CM

## 2017-01-29 DIAGNOSIS — M79609 Pain in unspecified limb: Secondary | ICD-10-CM

## 2017-01-29 DIAGNOSIS — K2970 Gastritis, unspecified, without bleeding: Secondary | ICD-10-CM

## 2017-01-29 DIAGNOSIS — N179 Acute kidney failure, unspecified: Secondary | ICD-10-CM

## 2017-01-29 DIAGNOSIS — M109 Gout, unspecified: Secondary | ICD-10-CM

## 2017-01-29 DIAGNOSIS — I10 Essential (primary) hypertension: Secondary | ICD-10-CM

## 2017-01-29 LAB — BASIC METABOLIC PANEL
ANION GAP: 9 (ref 5–15)
BUN: 29 mg/dL — AB (ref 6–20)
CALCIUM: 8.8 mg/dL — AB (ref 8.9–10.3)
CO2: 23 mmol/L (ref 22–32)
Chloride: 103 mmol/L (ref 101–111)
Creatinine, Ser: 1.34 mg/dL — ABNORMAL HIGH (ref 0.61–1.24)
GFR calc Af Amer: >60 mL/min (ref >60)
GLUCOSE: 147 mg/dL — AB (ref 65–99)
Potassium: 4.1 mmol/L (ref 3.5–5.1)
Sodium: 135 mmol/L (ref 135–145)

## 2017-01-29 LAB — CBC
HEMATOCRIT: 22.5 % — AB (ref 39.0–52.0)
Hemoglobin: 7.8 g/dL — ABNORMAL LOW (ref 13.0–17.0)
MCH: 35.1 pg — AB (ref 26.0–34.0)
MCHC: 34.7 g/dL (ref 30.0–36.0)
MCV: 101.4 fL — AB (ref 78.0–100.0)
PLATELETS: 380 10*3/uL (ref 150–400)
RBC: 2.22 MIL/uL — ABNORMAL LOW (ref 4.22–5.81)
RDW: 21.9 % — AB (ref 11.5–15.5)
WBC: 14.9 10*3/uL — AB (ref 4.0–10.5)

## 2017-01-29 LAB — CULTURE, BLOOD (ROUTINE X 2)
CULTURE: NO GROWTH
Culture: NO GROWTH
SPECIAL REQUESTS: ADEQUATE
Special Requests: ADEQUATE

## 2017-01-29 LAB — FOLATE RBC
FOLATE, HEMOLYSATE: 232.9 ng/mL
Folate, RBC: 1008 ng/mL (ref >498)
Hematocrit: 23.1 % — ABNORMAL LOW (ref 37.5–51.0)

## 2017-01-29 MED ORDER — HYDRALAZINE HCL 50 MG PO TABS
75.0000 mg | ORAL_TABLET | Freq: Three times a day (TID) | ORAL | Status: DC
  Administered 2017-01-29 – 2017-01-31 (×7): 75 mg via ORAL
  Filled 2017-01-29 (×7): qty 1

## 2017-01-29 MED ORDER — METHOCARBAMOL 500 MG PO TABS
500.0000 mg | ORAL_TABLET | Freq: Four times a day (QID) | ORAL | Status: DC | PRN
  Administered 2017-01-29 – 2017-01-31 (×7): 500 mg via ORAL
  Filled 2017-01-29 (×7): qty 1

## 2017-01-29 MED ORDER — LABETALOL HCL 5 MG/ML IV SOLN
10.0000 mg | Freq: Once | INTRAVENOUS | Status: AC
  Administered 2017-01-29: 10 mg via INTRAVENOUS
  Filled 2017-01-29: qty 4

## 2017-01-29 NOTE — Evaluation (Signed)
Occupational Therapy Evaluation Patient Details Name: Scott [name] MRN: [id number] DOB: 06/21/1965 Today's Date: 01/29/2017    History of Present Illness 51 year old male with history of recent hospitalization at at an outside facility with GI bleed secondary to gastritis from NSAID use for gout requiring blood transfusions presented to [facility] on 01/24/2017 with complaints of left knee pain. Pt being treated for gout.   Clinical Impression   Pt admitted with gout. Pt currently with functional limitations due to the deficits listed below (see OT Problem List).  Pt will benefit from skilled OT to increase their safety and independence with ADL and functional mobility for ADL to facilitate discharge to venue listed below.      Follow Up Recommendations  SNF    Equipment Recommendations  None recommended by OT       Precautions / Restrictions Precautions Precautions: Fall Restrictions Weight Bearing Restrictions: No      Mobility Bed Mobility Overal bed mobility: Needs Assistance Bed Mobility: Supine to Sit     Supine to sit: Mod assist;HOB elevated     General bed mobility comments: Assist for bil LEs. Increased time. Encouraged pt to do as much as he could unassisted.   Transfers Overall transfer level: Needs assistance Equipment used: Right platform walker Transfers: Sit to/from Stand Sit to Stand: Max assist;+2 physical assistance;+2 safety/equipment;From elevated surface         General transfer comment: Sit to stand x 3. Pt only able to tolerate standing for ~20-30 seconds before asking to sit down. Significantly flexed trunk posture. Multimodal cueing for upright trunk and for pt to extend UEs to aid with Wbing. R PF did not really improve UE WBing .     Balance Overall balance assessment: Needs assistance         Standing balance support: Bilateral upper extremity supported Standing balance-Leahy Scale: Zero Standing balance comment: MAX A with  RW due to pain and weakness                           ADL either performed or assessed with clinical judgement   ADL Overall ADL's : Needs assistance/impaired Eating/Feeding: Minimal assistance;Sitting   Grooming: Moderate assistance;Sitting   Upper Body Bathing: Moderate assistance;Sitting   Lower Body Bathing: +2 for safety/equipment;+2 for physical assistance;Maximal assistance;Total assistance   Upper Body Dressing : Moderate assistance;Sitting   Lower Body Dressing: +2 for safety/equipment;+2 for physical assistance;Maximal assistance;Moderate assistance;Cueing for safety;Cueing for sequencing                 General ADL Comments: sit to stand only     Vision Patient Visual Report: No change from baseline              Pertinent Vitals/Pain Pain Assessment: 0-10 Pain Score: 9  Pain Location: 9/10 at rest bil LEs and R UE Pain Descriptors / Indicators: Pins and needles;Aching;Sore;Discomfort;Grimacing Pain Intervention(s): Limited activity within patient's tolerance;Monitored during session;Repositioned;Premedicated before session     Hand Dominance Right   Extremity/Trunk Assessment Upper Extremity Assessment RUE Deficits / Details: R UE swollen and pt using it as little as possible.   Lower Extremity Assessment RLE Deficits / Details: pt able to move shoulder and elbow with encouragement- very limited movement of LUE RLE: Unable to fully assess due to pain LLE Deficits / Details: swollen with limited ROM LLE: Unable to fully assess due to pain       Communication Communication Communication: No  difficulties   Cognition Arousal/Alertness: Awake/alert Behavior During Therapy: WFL for tasks assessed/performed Overall Cognitive Status: Within Functional Limits for tasks assessed                                                Home Living Family/patient expects to be discharged to:: Private residence Living Arrangements:  Spouse/significant other Available Help at Discharge: Family;Available PRN/intermittently Type of Home: House Home Access: Stairs to enter Entergy CorporationEntrance Stairs-Number of Steps: 3 Entrance Stairs-Rails: Right;Left;Can reach both Home Layout: Bed/bath upstairs;Able to live on main level with bedroom/bathroom;1/2 bath on main level     Bathroom Shower/Tub: Tub/shower unit;Walk-in shower   Bathroom Toilet: Standard     Home Equipment: Cane - single point          Prior Functioning/Environment Level of Independence: Independent        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        OT Problem List: Decreased strength;Decreased activity tolerance;Impaired balance (sitting and/or standing);Decreased range of motion;Impaired UE functional use;Pain;Decreased safety awareness;Decreased coordination;Decreased knowledge of use of DME or AE      OT Treatment/Interventions: Self-care/ADL training;Patient/family education;DME and/or AE instruction    OT Goals(Current goals can be found in the care plan section)    OT Frequency: Min 2X/week   Barriers to [name]/C: Decreased caregiver support          Co-evaluation PT/OT/SLP Co-Evaluation/Treatment: Yes Reason for Co-Treatment: For patient/therapist safety;To address functional/ADL transfers PT goals addressed during session: Mobility/safety with mobility OT goals addressed during session: ADL's and self-care      AM-PAC PT "6 Clicks" Daily Activity     Outcome Measure Help from another person eating meals?: None Help from another person taking care of personal grooming?: A Little Help from another person toileting, which includes using toliet, bedpan, or urinal?: Total Help from another person bathing (including washing, rinsing, drying)?: A Lot Help from another person to put on and taking off regular upper body clothing?: A Lot Help from another person to put on and taking off regular lower body clothing?: Total 6 Click Score: 6   End  of Session Equipment Utilized During Treatment: Gait belt;Rolling walker Nurse Communication: Mobility status  Activity Tolerance: Patient limited by pain Patient left: in bed;Other (comment)(sitting EOB. CNA aware)  OT Visit Diagnosis: Unsteadiness on feet (R26.81);Pain;Other abnormalities of gait and mobility (R26.89) Pain - part of body: Hand                Time: [id number] OT Time Calculation (min): 50 min Charges:  OT General Charges $OT Visit: 1 Visit OT Evaluation $OT Eval Moderate Complexity: 1 Mod G-Codes:     Lise AuerLori Shawntae Lowy, [place]OT [phone]  Scott CrowEDDING, Scott [name] 01/29/2017, 11:52 AM

## 2017-01-29 NOTE — Progress Notes (Signed)
Physical Therapy Treatment Patient Details Name: Scott [name] MRN: [id number] DOB: 11/03/1965 Today's Date: 01/29/2017    History of Present Illness 51 year old male with history of recent hospitalization at at an outside facility with GI bleed secondary to gastritis from NSAID use for gout requiring blood transfusions presented to [facility] on 01/24/2017 with complaints of left knee pain. Pt being treated for gout.    PT Comments    Pt continues to require significant assistance. Min assist for bed mobility and Max assist +2 for standing. Pt rated pain as 9/10. Pain continues to limit pt's ability to transfer and ambulate at this time. Feel best plan is ST rehab at SNF, if pt will agree. If pt progresses well over next few days, pt may be able to d/c home with assistance. Will continue to follow.     Follow Up Recommendations  SNF     Equipment Recommendations  Rolling walker with 5" wheels;3in1 (PT)    Recommendations for Other Services       Precautions / Restrictions Precautions Precautions: Fall Restrictions Weight Bearing Restrictions: No    Mobility  Bed Mobility Overal bed mobility: Needs Assistance Bed Mobility: Supine to Sit     Supine to sit: Min assist;HOB elevated     General bed mobility comments: Assist for bil LEs. Increased time. Encouraged pt to do as much as he could unassisted.   Transfers Overall transfer level: Needs assistance Equipment used: Right platform walker Transfers: Sit to/from Stand Sit to Stand: Max assist;+2 physical assistance;+2 safety/equipment;From elevated surface         General transfer comment: Sit to stand x 3. Pt only able to tolerate standing for ~20-30 seconds before asking to sit down. Significantly flexed trunk posture. Multimodal cueing for upright trunk and for pt to extend UEs to aid with Wbing. R PF did not really improve UE WBing .   Ambulation/Gait             General Gait Details: Pt is unable  at this time due to significant pain   Stairs            Wheelchair Mobility    Modified Rankin (Stroke Patients Only)       Balance Overall balance assessment: Needs assistance         Standing balance support: Bilateral upper extremity supported Standing balance-Leahy Scale: Zero Standing balance comment: MAX A with RW due to pain and weakness                            Cognition Arousal/Alertness: Awake/alert Behavior During Therapy: WFL for tasks assessed/performed Overall Cognitive Status: Within Functional Limits for tasks assessed                                        Exercises      General Comments        Pertinent Vitals/Pain Pain Assessment: 0-10 Pain Score: 9  Pain Location: 9/10 at rest bil LEs and R UE Pain Descriptors / Indicators: Pins and needles;Aching;Sore;Discomfort;Grimacing Pain Intervention(s): Limited activity within patient's tolerance;Repositioned;Premedicated before session    Home Living                      Prior Function            PT Goals (current goals can now  be found in the care plan section) Progress towards PT goals: Progressing toward goals    Frequency    Min 3X/week      PT Plan Discharge plan needs to be updated    Co-evaluation              AM-PAC PT "6 Clicks" Daily Activity  Outcome Measure  Difficulty turning over in bed (including adjusting bedclothes, sheets and blankets)?: A Lot Difficulty moving from lying on back to sitting on the side of the bed? : Unable Difficulty sitting down on and standing up from a chair with arms (e.g., wheelchair, bedside commode, etc,.)?: Unable Help needed moving to and from a bed to chair (including a wheelchair)?: Total Help needed walking in hospital room?: Total Help needed climbing 3-5 steps with a railing? : Total 6 Click Score: 7    End of Session Equipment Utilized During Treatment: Gait belt Activity  Tolerance: Patient limited by pain;Patient limited by fatigue Patient left: in bed;with call bell/phone within reach(sitting EOB waiting on NT)   PT Visit Diagnosis: Difficulty in walking, not elsewhere classified (R26.2);Pain;Muscle weakness (generalized) (M62.81) Pain - part of body: (R UE, bil LEs)     Time: [id number] PT Time Calculation (min) (ACUTE ONLY): 28 min  Charges:  $Therapeutic Activity: 8-22 mins                    G Codes:          Rebeca AlertJannie Rafeal Skibicki, MPT Pager: [phone]

## 2017-01-29 NOTE — Progress Notes (Signed)
Subjective: Pt reports that his left knee pain has improved considerably.  He c/o continued pain and swelling in the right hand.  He denies any h/o gout in the right wrist or elbow.  He struggled with PT yesterday.   Objective: Vital signs in last 24 hours: Temp:  [98.5 F (36.9 C)-99.5 F (37.5 C)] 98.7 F (37.1 C) (12/31 0448) Pulse Rate:  [97-101] 97 (12/31 0448) Resp:  [18] 18 (12/31 0448) BP: (155-178)/(87-105) 169/102 (12/31 0448) SpO2:  [99 %-100 %] 100 % (12/31 0448)  Intake/Output from previous day: 12/30 0701 - 12/31 0700 In: 1068.3 [P.O.:480; I.V.:588.3] Out: 2800 [Urine:2800] Intake/Output this shift: Total I/O In: 0  Out: 400 [Urine:400]  Recent Labs    01/27/17 0426 01/28/17 0343 01/29/17 0439  HGB 7.2* 7.7* 7.8*   Recent Labs    01/28/17 0343 01/29/17 0439  WBC 14.0* 14.9*  RBC 2.26* 2.22*  HCT 23.4* 22.5*  PLT 348 380   Recent Labs    01/28/17 0343 01/29/17 0439  NA 140 135  K 4.0 4.1  CL 107 103  CO2 25 23  BUN 34* 29*  CREATININE 1.49* 1.34*  GLUCOSE 150* 147*  CALCIUM 8.6* 8.8*   No results for input(s): LABPT, INR in the last 72 hours.  wn wd male in nad.  R had with swelling dorsally.  No fluctuance.  5/5 grip strength. diffusely TTP at the hand.  No focal tenderness at the wrist.  Pronation and supination of the elbow is without pain.  Forearm is swollen but fluctuance.  No TTP at the radial head.  Skin healthy and intact.  Brisk cap refill at the fingers.  Assessment/Plan: L knee effusion - cxs neg on final report.  Crystals +.  Gout is most likely explanation for the left knee pain and effusion.  His symptoms are improving.  He can bear weight as tolerated.  Continue PT for ambulation.  R wrist / hand pain - gout is the most likely diagnosis for the R UE pain.  Recommend continuing systemic steroids.  F/u with ortho prn.  Ortho signing off.  Please call [phone] with any questions.    Toni ArthursHEWITT, Hanan Mcwilliams 01/29/2017, 7:52 AM

## 2017-01-29 NOTE — Progress Notes (Signed)
Right upper extremity venous duplex has been completed. Negative for DVT.  01/29/17 9:21 AM Olen CordialGreg Xoey Warmoth RVT

## 2017-01-29 NOTE — Progress Notes (Signed)
Received report from previous RN. Pts assessment is unchanged from previous assessment. Will continue to monitor

## 2017-01-29 NOTE — Plan of Care (Signed)
  Not Progressing Pain Managment: General experience of comfort will improve 01/29/2017 0826 - Not Progressing by Epimenio FootGurley, Ciin Brazzel D, RN

## 2017-01-29 NOTE — Progress Notes (Signed)
[facility] PROGRESS NOTE  Scott EvenerKurk [name] ZOX:[id number] DOB: 01/26/1966 DOA: 01/24/2017  PCP: Patient, No Pcp Per  Brief History/Interval Summary: 51 year old African-American male with a past medical history significant for recent hospitalization at an outside facility for GI bleed secondary to gastritis from NSAID use.  He was using NSAIDs for gout.  He required blood transfusion.  He presented to [facility] on 12/26 with complains of left knee pain.  Patient was thought to have acute gouty arthritis.  He was started on colchicine and prednisone.  He also found to have left lower lobar pneumonia.  Reason for Visit: Acute gout flare.  Pneumonia.  Consultants: Orthopedics  Procedures: Arthrocentesis  Antibiotics: Vantin and azithromycin   Subjective/Interval History: Patient continues to have pain in his joints especially his left knee as well as the right elbow and wrist.  He states that symptoms have slightly improved compared to yesterday.  ROS: denies any shortness of breath, chest pain, nausea or vomiting.  Objective:  Vital Signs  Vitals:   01/29/17 0140 01/29/17 0448 01/29/17 0932 01/29/17 1311  BP: (!) 157/87 (!) 169/102 (!) 171/97 (!) 171/102  Pulse:  97  (!) 106  Resp:  18  16  Temp:  98.7 F (37.1 C)  99.1 F (37.3 C)  TempSrc:  Oral  Oral  SpO2:  100%  100%  Weight:      Height:        Intake/Output Summary (Last 24 hours) at 01/29/2017 1411 Last data filed at 01/29/2017 1314 Gross per 24 hour  Intake 240 ml  Output 2850 ml  Net -2610 ml   Filed Weights   01/24/17 1321  Weight: 101.2 kg (223 lb)    General appearance: alert, cooperative, appears stated age and no distress Head: Normocephalic, without obvious abnormality, atraumatic Resp: Good air entry bilaterally.  No wheezing rales or rhonchi.  Normal effort. Cardio: regular rate and rhythm, S1, S2 normal, no murmur, click, rub or gallop GI: soft, non-tender; bowel sounds normal;  no masses,  no organomegaly Extremities: Does have swelling involving the left knee.  Slightly warm to touch compared to the right knee.  Restricted range of motion.  Also has swelling involving the right wrist and elbow with restricted range of motion. Neurologic: No focal neurological deficits.  Lab Results:  Data Reviewed: I have personally reviewed following labs and imaging studies  CBC: Recent Labs  Lab 01/24/17 0855 01/25/17 0353 01/26/17 0359 01/27/17 0426 01/28/17 0343 01/29/17 0439  WBC 17.4* 16.3* 16.6* 17.1* 14.0* 14.9*  NEUTROABS 14.1*  --  13.9*  --   --   --   HGB 8.6* 8.1* 7.5* 7.2* 7.7* 7.8*  HCT 24.9* 24.0* 22.0* 21.0* 23.4* 22.5*  MCV 104.2* 104.3* 104.3* 102.9* 103.5* 101.4*  PLT 284 254 234 287 348 380    Basic Metabolic Panel: Recent Labs  Lab 01/24/17 1124 01/24/17 1425 01/25/17 0353 01/26/17 0359 01/27/17 0426 01/28/17 0343 01/29/17 0439  NA  --  138 133* 136 139 140 135  K  --  3.0* 3.3* 4.8 4.2 4.0 4.1  CL  --  92* 92* 100* 105 107 103  CO2  --  31 28 25 25 25 23   GLUCOSE  --  153* 130* 187* 159* 150* 147*  BUN  --  16 21* 31* 40* 34* 29*  CREATININE  --  0.92 1.15 1.59* 1.72* 1.49* 1.34*  CALCIUM  --  6.7* 6.5* 7.1* 8.0* 8.6* 8.8*  MG 0.7* 0.8*  --  1.3* 1.8  --   --     GFR: Estimated Creatinine Clearance: 80.8 mL/min (A) (by C-G formula based on SCr of 1.34 mg/dL (H)).  Liver Function Tests: Recent Labs  Lab 01/24/17 0855 01/26/17 0359  AST 49* 44*  ALT 26 22  ALKPHOS 101 92  BILITOT 2.0* 1.3*  PROT 7.4 6.5  ALBUMIN 3.4* 2.5*    CBG: Recent Labs  Lab 01/24/17 1249  GLUCAP 134*     Recent Results (from the past 240 hour(s))  Body fluid culture     Status: None   Collection Time: 01/24/17 12:32 PM  Result Value Ref Range Status   Specimen Description SYNOVIAL LEFT KNEE  Final   Special Requests NONE  Final   Gram Stain   Final    ABUNDANT WBC PRESENT, PREDOMINANTLY PMN NO ORGANISMS SEEN Gram Stain Report Called  to,Read Back By and Verified With: HUFF, J. RN @1405  ON 12.26.18 BY [email or web address]    Culture   Final    NO GROWTH 3 DAYS Performed at [facility], [address], [place], [place] [address]    Report Status 01/27/2017 FINAL  Final  Culture, blood (routine x 2)     Status: None   Collection Time: 01/24/17  3:18 PM  Result Value Ref Range Status   Specimen Description BLOOD LEFT HAND  Final   Special Requests   Final    BOTTLES DRAWN AEROBIC AND ANAEROBIC Blood Culture adequate volume   Culture   Final    NO GROWTH 5 DAYS Performed at [facility], [address] [place], [place] [address]    Report Status 01/29/2017 FINAL  Final  Culture, blood (routine x 2)     Status: None   Collection Time: 01/24/17  3:18 PM  Result Value Ref Range Status   Specimen Description BLOOD LEFT HAND  Final   Special Requests   Final    BOTTLES DRAWN AEROBIC AND ANAEROBIC Blood Culture adequate volume   Culture   Final    NO GROWTH 5 DAYS Performed at [facility], [address] [place], [place] [address]    Report Status 01/29/2017 FINAL  Final      Radiology Studies: US Renal  Result Date: 01/28/2017 CLINICAL DATA:  Acute kidney injury. EXAM: RENAL / URINARY TRACT ULTRASOUND COMPLETE COMPARISON:  None. FINDINGS: Right Kidney: Length: 11.1 cm. Echogenicity within normal limits. No mass or hydronephrosis visualized. Left Kidney: Length: 11.4 cm. Echogenicity within normal limits. No mass or hydronephrosis visualized. Bladder: Appears normal for degree of bladder distention. IMPRESSION: Normal renal ultrasound. Electronically Signed   By: Obie Dredge M.D.   On: 01/28/2017 10:05     Medications:  Scheduled: . allopurinol  100 mg Oral Daily  . amLODipine  10 mg Oral Daily  . azithromycin  500 mg Oral Daily  . cefpodoxime  200 mg Oral Q12H  . hydrALAZINE  50 mg Oral TID  . pantoprazole  40 mg Oral Daily  . predniSONE  60 mg Oral Q breakfast    Continuous:  BJY:[id number], fentaNYL (SUBLIMAZE) injection, hydrALAZINE, methocarbamol, oxyCODONE  Assessment/Plan:  Principal Problem:   Gout attack Active Problems:   Hypokalemia   Gastritis   h/o recent GIB (gastrointestinal bleeding)   Hypomagnesemia    Acute gout flare Primarily involving the left knee and right wrist and elbow.  Patient did undergo arthrocentesis which revealed a white cell count of 61,500 with 97% neutrophils.  Intracellular monosodium urate  crystals were noted.  Cultures have been negative so far.  Orthopedics was consulted.  Patient is currently on systemic steroids.  Symptoms are slowly improving.  PT and OT evaluation.  Upper extremity Doppler study negative for DVT.  Sepsis secondary to left lower lobe pneumonia Patient was febrile with a temperature of 102.3 F at admission.  He was also tachycardic tachypneic and had leukocytosis.  Chest x-ray revealed left lower lobe pneumonia.  Strep pneumo and Legionella antigens have been negative.  Patient was started on antibiotics.  He remains on cefpodoxime and azithromycin.  Respiratory status is stable.  Saturating normal on room air.  Elevated blood pressure/essential hypertension Blood pressure is noted to be elevated.  Could be due to pain and steroids.  Patient is on amlodipine which will be continued.  Also noted to be on hydralazine.  May need to increase the dose further.  Acute kidney injury Likely related to medications.  Renal ultrasound was unremarkable.  Creatinine is stable.  Monitor urine output.  Macrocytic anemia Patient was recently hospitalized for GI bleed and had multiple blood transfusions.  Hemoglobin has been stable.  No evidence for overt bleeding.  History of gastritis with recent GI bleeding Hemoglobin stable.  Continue PPI.  DVT Prophylaxis: SCDs    Code Status: Full Code Family Communication: Discussed with the patient Disposition Plan: Management as outlined above.   Await improvement in joint pains.  Physical therapy recommending skilled nursing facility.    LOS: 3 days   Osvaldo ShipperGokul Younis Mathey  [facility] Pager [phone] 01/29/2017, 2:11 PM  If 7PM-7AM, please contact night-coverage at [email or web address], password [email or web address]

## 2017-01-30 NOTE — Plan of Care (Signed)
  Progressing Pain Managment: General experience of comfort will improve 01/30/2017 0815 - Progressing by Epimenio FootGurley, Joneisha Miles D, RN

## 2017-01-30 NOTE — Clinical Social Work Note (Signed)
Clinical Social Work Assessment  Patient Details  Name: Scott EvenerKurk Devora MRN: [id number] Date of Birth: 04/11/1965  Date of referral:  01/30/17               Reason for consult:  Facility Placement                Permission sought to share information with:  Oceanographeracility Contact Representative Permission granted to share information::  Yes, Verbal Permission Granted  Name::        Agency::     Relationship::     Contact Information:     Housing/Transportation Living arrangements for the past 2 months:  Single Family Home Source of Information:  Patient Patient Interpreter Needed:  None Criminal Activity/Legal Involvement Pertinent to Current Situation/Hospitalization:  No - Comment as needed Significant Relationships:  Significant Other Lives with:  Significant Other Do you feel safe going back to the place where you live?  (PT recommending SNF) Need for family participation in patient care:  No (Coment)  Care giving concerns:  Patient from home with girlfriend. Patient reported that prior to hospitalization he was independent with ambulation and ADLs. Patient reported that currently he is unable to ambulate. PT recommending SNF.    Social Worker assessment / plan:  CSW spoke with patient at bedside regarding PT recommendation for SNF. Patient reported that he ia agreeable if he does not progress in the hospital. Patient reported that he is currently unable to ambulate. CSW explained SNF placement process, patient verbalized understanding. Patient reported that he would prefer to a SNF in [place] if possible.   CSW will complete FL2 and provide bed offers. CSW will continue to follow and assist patient with discharge planning.  Employment status:  [employer]ical [employer] Insurance information:  Managed Care PT Recommendations:  Skilled Nursing Facility Information / Referral to community resources:  Skilled Nursing Facility  Patient/Family's Response to care:  Patient agreeable to SNF if he  doesn't progress at the hospital and is unable to dc home.   Patient/Family's Understanding of and Emotional Response to Diagnosis, Current Treatment, and Prognosis:  Patient presented calm and verbalized understanding diagnosis and current treatment. Patient reported that he would prefer to return home at discharge but is not sure if he will be able to because currently he cant walk. CSW provided active listening and agreed to follow up with patient about discharge plans.   Emotional Assessment Appearance:  Appears stated age Attitude/Demeanor/Rapport:  Other(Cooperative) Affect (typically observed):  Calm Orientation:  Oriented to Self, Oriented to Place, Oriented to  Time, Oriented to Situation Alcohol / Substance use:  Not Applicable Psych involvement (Current and /or in the community):  No (Comment)  Discharge Needs  Concerns to be addressed:  Care Coordination Readmission within the last 30 days:  No Current discharge risk:  Physical Impairment Barriers to Discharge:  Continued Medical Work up   USG CorporationKimberly L Octavian Godek, LCSW 01/30/2017, 1:32 PM

## 2017-01-30 NOTE — Progress Notes (Signed)
[facility] PROGRESS NOTE  Scott [name] JXB:[id number] DOB: 10/07/1965 DOA: 01/24/2017  PCP: Patient, No Pcp Per  Brief History/Interval Summary: 52 year old African-American male with a past medical history significant for recent hospitalization at an outside facility for GI bleed secondary to gastritis from NSAID use.  He was using NSAIDs for gout.  He required blood transfusion.  [facility] with complains of left knee pain.  Patient was thought to have acute gouty arthritis.  He was started on colchicine and prednisone.  He also found to have left lower lobar pneumonia.  Reason for Visit: Acute gout flare.  Pneumonia.  Consultants: Orthopedics  Procedures: Arthrocentesis  Antibiotics: Vantin and azithromycin   Subjective/Interval History: Patient states that the pain in his left knee and right elbow and right wrist is improving.  Continues to have pain however.  Cough is much better.  Denies any shortness of breath.    ROS: Denies any nausea or vomiting  Objective:  Vital Signs  Vitals:   01/29/17 0932 01/29/17 1311 01/29/17 2128 01/30/17 0522  BP: (!) 171/97 (!) 171/102 (!) 149/80 (!) 163/94  Pulse:  (!) 106 95 80  Resp:  16 16 16   Temp:  99.1 F (37.3 C) 99.7 F (37.6 C) 98.4 F (36.9 C)  TempSrc:  Oral Oral Oral  SpO2:  100% 98% 99%  Weight:      Height:        Intake/Output Summary (Last 24 hours) at 01/30/2017 0933 Last data filed at 01/30/2017 0749 Gross per 24 hour  Intake 0 ml  Output 1875 ml  Net -1875 ml   Filed Weights   01/24/17 1321  Weight: 101.2 kg (223 lb)    General appearance: Awake alert.  In no distress Resp: Normal effort.  Good air entry bilaterally.  No definite crackles. Cardio: S1-S2 is normal regular.  No S3-S4.  No rubs murmurs of bruit GI: Abdomen is soft.  Nontender nondistended.  Bowel sounds are present.  No masses organomegaly Extremities: Left knee swelling is stable.  Remains warm to  touch.  Improved range of motion today compared to yesterday.  Improved range of motion of the right wrist and elbow.   Neurologic: No focal neurological deficits.  Lab Results:  Data Reviewed: I have personally reviewed following labs and imaging studies  CBC: Recent Labs  Lab 01/24/17 0855 01/25/17 0353 01/26/17 0359 01/27/17 0426 01/28/17 0343 01/29/17 0439  WBC 17.4* 16.3* 16.6* 17.1* 14.0* 14.9*  NEUTROABS 14.1*  --  13.9*  --   --   --   HGB 8.6* 8.1* 7.5* 7.2* 7.7* 7.8*  HCT 24.9* 24.0* 22.0*  23.1* 21.0* 23.4* 22.5*  MCV 104.2* 104.3* 104.3* 102.9* 103.5* 101.4*  PLT 284 254 234 287 348 380    Basic Metabolic Panel: Recent Labs  Lab 01/24/17 1124 01/24/17 1425 01/25/17 0353 01/26/17 0359 01/27/17 0426 01/28/17 0343 01/29/17 0439  NA  --  138 133* 136 139 140 135  K  --  3.0* 3.3* 4.8 4.2 4.0 4.1  CL  --  92* 92* 100* 105 107 103  CO2  --  31 28 25 25 25 23   GLUCOSE  --  153* 130* 187* 159* 150* 147*  BUN  --  16 21* 31* 40* 34* 29*  CREATININE  --  0.92 1.15 1.59* 1.72* 1.49* 1.34*  CALCIUM  --  6.7* 6.5* 7.1* 8.0* 8.6* 8.8*  MG 0.7* 0.8*  --  1.3* 1.8  --   --  GFR: Estimated Creatinine Clearance: 80.8 mL/min (A) (by C-G formula based on SCr of 1.34 mg/dL (H)).  Liver Function Tests: Recent Labs  Lab 01/24/17 0855 01/26/17 0359  AST 49* 44*  ALT 26 22  ALKPHOS 101 92  BILITOT 2.0* 1.3*  PROT 7.4 6.5  ALBUMIN 3.4* 2.5*    CBG: Recent Labs  Lab 01/24/17 1249  GLUCAP 134*     Recent Results (from the past 240 hour(s))  Body fluid culture     Status: None   Collection Time: 01/24/17 12:32 PM  Result Value Ref Range Status   Specimen Description SYNOVIAL LEFT KNEE  Final   Special Requests NONE  Final   Gram Stain   Final    ABUNDANT WBC PRESENT, PREDOMINANTLY PMN NO ORGANISMS SEEN Gram Stain Report Called to,Read Back By and Verified With: HUFF, J. RN @1405  ON 12.26.18 BY NMCCOY    Culture   Final    NO GROWTH 3  DAYS Performed at [facility], [address], [place], [place] [address]    Report Status 01/27/2017 FINAL  Final  Culture, blood (routine x 2)     Status: None   Collection Time: 01/24/17  3:18 PM  Result Value Ref Range Status   Specimen Description BLOOD LEFT HAND  Final   Special Requests   Final    BOTTLES DRAWN AEROBIC AND ANAEROBIC Blood Culture adequate volume   Culture   Final    NO GROWTH 5 DAYS Performed at [facility], [address] [place], [place] [address]    Report Status 01/29/2017 FINAL  Final  Culture, blood (routine x 2)     Status: None   Collection Time: 01/24/17  3:18 PM  Result Value Ref Range Status   Specimen Description BLOOD LEFT HAND  Final   Special Requests   Final    BOTTLES DRAWN AEROBIC AND ANAEROBIC Blood Culture adequate volume   Culture   Final    NO GROWTH 5 DAYS Performed at [facility], [address], [place], [place] [address]    Report Status 01/29/2017 FINAL  Final      Radiology Studies: No results found.   Medications:  Scheduled: . allopurinol  100 mg Oral Daily  . amLODipine  10 mg Oral Daily  . azithromycin  500 mg Oral Daily  . cefpodoxime  200 mg Oral Q12H  . hydrALAZINE  75 mg Oral TID  . pantoprazole  40 mg Oral Daily  . predniSONE  60 mg Oral Q breakfast   Continuous:  [id number], fentaNYL (SUBLIMAZE) injection, hydrALAZINE, methocarbamol, oxyCODONE  Assessment/Plan:  Principal Problem:   Gout attack Active Problems:   Hypokalemia   Gastritis   h/o recent GIB (gastrointestinal bleeding)   Hypomagnesemia    Acute gout flare Primarily involving the left knee and right wrist and elbow.  Patient did undergo arthrocentesis which revealed a white cell count of 61,500 with 97% neutrophils.  Intracellular monosodium urate crystals were noted.  Cultures have been negative so far.  Orthopedics was consulted.  Patient is currently on systemic steroids.  Symptoms are slowly improving.   Upper extremity Doppler study negative for DVT.  Seen by physical therapy.  They are recommending skilled nursing facility.  Hopefully patient symptoms will continue to improve.  We will have physical therapy reevaluate tomorrow.  He would prefer to go home if at all possible.  Sepsis secondary to left lower lobe pneumonia Patient was febrile with a temperature  of 102.3 F at admission.  He was also tachycardic tachypneic and had leukocytosis.  Chest x-ray revealed left lower lobe pneumonia.  Strep pneumo and Legionella antigens have been negative.  Patient was started on antibiotics.  He remains on cefpodoxime and azithromycin.  Respiratory status is stable.  Saturating normal on room air.  Elevated blood pressure/essential hypertension Blood pressure is noted to be elevated.  Could be due to pain and steroids.  Patient is on amlodipine which will be continued.  Dose of hydralazine was increased.  Continue to monitor for now  Acute kidney injury Likely related to medications.  Renal ultrasound was unremarkable.  Creatinine has been improving.  Monitor urine output.    Macrocytic anemia Patient was recently hospitalized for GI bleed and had multiple blood transfusions.  Hemoglobin has been stable.  No evidence for overt bleeding.  History of gastritis with recent GI bleeding Hemoglobin stable.  Continue PPI.  DVT Prophylaxis: SCDs    Code Status: Full Code Family Communication: Discussed with the patient Disposition Plan: Management as outlined above.  Physical therapy recommending skilled nursing facility.  Social worker to see.    LOS: 4 days   Osvaldo Shipper  [facility] Pager [phone] 01/30/2017, 9:33 AM  If 7PM-7AM, please contact night-coverage at [email or web address], password [facility]

## 2017-01-30 NOTE — NC FL2 (Signed)
[place] MEDICAID FL2 LEVEL OF CARE SCREENING TOOL     IDENTIFICATION  Patient Name: Scott [name] Birthdate: 08/16/1965 Sex: male Admission Date (Current Location): 01/24/2017  [facility] [place]   Facility and Address:  [facility],  [address], [place] [id number]      Provider Number: [id number]  Attending Physician Name and Address:  Osvaldo ShipperKrishnan, Gokul, MD  Relative Name and Phone Number:       Current Level of Care: Hospital Recommended Level of Care: Skilled Nursing Facility Prior Approval Number:    Date Approved/Denied:   PASRR Number: [id number]  Discharge Plan: SNF    Current Diagnoses: Patient Active Problem List   Diagnosis Date Noted  . Hypokalemia 01/24/2017  . Gout attack 01/24/2017  . Gastritis 01/24/2017  . h/o recent GIB (gastrointestinal bleeding) 01/24/2017  . Hypomagnesemia 01/24/2017  . Anemia   . Hypertension     Orientation RESPIRATION BLADDER Height & Weight     Self, Time, Situation, Place  Normal Continent Weight: 223 lb (101.2 kg) Height:  5\' 11"  (180.3 cm)  BEHAVIORAL SYMPTOMS/MOOD NEUROLOGICAL BOWEL NUTRITION STATUS      Continent Diet  AMBULATORY STATUS COMMUNICATION OF NEEDS Skin   Extensive Assist Verbally Normal                       Personal Care Assistance Level of Assistance  Bathing, Feeding, Dressing Bathing Assistance: Maximum assistance Feeding assistance: Independent Dressing Assistance: Maximum assistance     Functional Limitations Info  Sight, Hearing, Speech Sight Info: Adequate Hearing Info: Adequate Speech Info: Adequate    SPECIAL CARE FACTORS FREQUENCY  PT (By licensed PT), OT (By licensed OT)     PT Frequency: 5x OT Frequency: 5x            Contractures Contractures Info: Not present    Additional Factors Info  Code Status, Allergies Code Status Info: Full Code Allergies Info: Ace Inhibitors           Current Medications (01/30/2017):  This is  the current hospital active medication list Current Facility-Administered Medications  Medication Dose Route Frequency Provider Last Rate Last Dose  . acetaminophen (TYLENOL) tablet 650 mg  650 mg Oral Q4H PRN Glade LloydAlekh, Kshitiz, MD   650 mg at 01/25/17 1741  . allopurinol (ZYLOPRIM) tablet 100 mg  100 mg Oral Daily Noralee Stainhoi, Jennifer, DO   100 mg at 01/30/17 0944  . amLODipine (NORVASC) tablet 10 mg  10 mg Oral Daily Tarry Kosavid, Rachal A, MD   10 mg at 01/30/17 0943  . azithromycin (ZITHROMAX) tablet 500 mg  500 mg Oral Daily Noralee Stainhoi, Jennifer, DO   500 mg at 01/30/17 0943  . cefpodoxime (VANTIN) tablet 200 mg  200 mg Oral Q12H Noralee Stainhoi, Jennifer, DO   200 mg at 01/30/17 0944  . fentaNYL (SUBLIMAZE) injection 100 mcg  100 mcg Intravenous Q2H PRN Haydee Monicaavid, Rachal A, MD   100 mcg at 01/25/17 1129  . hydrALAZINE (APRESOLINE) injection 5 mg  5 mg Intravenous Q4H PRN Noralee Stainhoi, Jennifer, DO   5 mg at 01/30/17 [id number]  . hydrALAZINE (APRESOLINE) tablet 75 mg  75 mg Oral TID Osvaldo ShipperKrishnan, Gokul, MD   75 mg at 01/30/17 0944  . methocarbamol (ROBAXIN) tablet 500 mg  500 mg Oral Q6H PRN Osvaldo ShipperKrishnan, Gokul, MD   500 mg at 01/30/17 [id number]  . oxyCODONE (Oxy IR/ROXICODONE) immediate release tablet 5-10 mg  5-10 mg Oral Q4H PRN Glade LloydAlekh, Kshitiz, MD  10 mg at 01/30/17 0634  . pantoprazole (PROTONIX) EC tablet 40 mg  40 mg Oral Daily Tarry Kos A, MD   40 mg at 01/30/17 0944  . predniSONE (DELTASONE) tablet 60 mg  60 mg Oral Q breakfast Freeman Caldron, PA-C   60 mg at 01/30/17 1610     Discharge Medications: Please see discharge summary for a list of discharge medications.  Relevant Imaging Results:  Relevant Lab Results:   Additional Information SSN [id number]  Antionette Poles, LCSW

## 2017-01-31 ENCOUNTER — Other Ambulatory Visit: Payer: Self-pay

## 2017-01-31 LAB — BASIC METABOLIC PANEL
ANION GAP: 8 (ref 5–15)
BUN: 38 mg/dL — ABNORMAL HIGH (ref 6–20)
CALCIUM: 8.8 mg/dL — AB (ref 8.9–10.3)
CO2: 26 mmol/L (ref 22–32)
Chloride: 102 mmol/L (ref 101–111)
Creatinine, Ser: 1.5 mg/dL — ABNORMAL HIGH (ref 0.61–1.24)
GFR calc non Af Amer: 53 mL/min — ABNORMAL LOW (ref >60)
GLUCOSE: 163 mg/dL — AB (ref 65–99)
POTASSIUM: 3.8 mmol/L (ref 3.5–5.1)
Sodium: 136 mmol/L (ref 135–145)

## 2017-01-31 MED ORDER — OXYCODONE HCL 5 MG PO TABS: 5.0000 mg | ORAL_TABLET | ORAL | 0 refills | Status: DC | PRN

## 2017-01-31 MED ORDER — METHOCARBAMOL 500 MG PO TABS: 500.0000 mg | ORAL_TABLET | Freq: Four times a day (QID) | ORAL | 0 refills | Status: AC | PRN

## 2017-01-31 MED ORDER — CEFPODOXIME PROXETIL 200 MG PO TABS: 200.0000 mg | ORAL_TABLET | Freq: Two times a day (BID) | ORAL | 0 refills | Status: AC

## 2017-01-31 MED ORDER — PREDNISONE 20 MG PO TABS: ORAL_TABLET | ORAL | 0 refills | Status: DC

## 2017-01-31 NOTE — Discharge Instructions (Signed)

## 2017-01-31 NOTE — Progress Notes (Signed)
Occupational Therapy Treatment Patient Details Name: Scott [name] MRN: [id number] DOB: 1965-06-21 Today's Date: 01/31/2017    History of present illness 52 year old male with history of recent hospitalization at at an outside facility with GI bleed secondary to gastritis from NSAID use for gout requiring blood transfusions presented to [facility] on 01/24/2017 with complaints of left knee pain. Pt being treated for gout.   OT comments  Improving with sit to stand. Tolerated SPT to chair. Will try built up foam to see it pt can self-feed with his dominant hand.  Follow Up Recommendations  SNF    Equipment Recommendations  None recommended by OT    Recommendations for Other Services      Precautions / Restrictions Precautions Precautions: Fall Restrictions Weight Bearing Restrictions: No       Mobility Bed Mobility         Supine to sit: Min guard     General bed mobility comments: for safety; HOB raised slightly and extra time  Transfers   Equipment used: Right platform walker   Sit to Stand: Min guard Stand pivot transfers: Min assist       General transfer comment: assist to turn walker; min guard for safety with sit to stand    Balance                                           ADL either performed or assessed with clinical judgement   ADL                           Toilet Transfer: Minimal assistance;Stand-pivot(platform walker to chair)             General ADL Comments: worked on sit to stand 4xs with RW and platform walker.  Pt was able to spt to chair.  RUE still with decreased AROM and he is R handed.  Will drop off built up foam to see if he can self-feeding using this (with R hand).  Pt has been using L as best he can right now.     Vision       Perception     Praxis      Cognition Arousal/Alertness: Awake/alert Behavior During Therapy: WFL for tasks assessed/performed Overall Cognitive Status:  Within Functional Limits for tasks assessed                                          Exercises     Shoulder Instructions       General Comments      Pertinent Vitals/ Pain       Pain Assessment: Faces Faces Pain Scale: Hurts even more Pain Location: L knee and R hand to elbow Pain Intervention(s): Limited activity within patient's tolerance;Monitored during session;Premedicated before session;Repositioned  Home Living                                          Prior Functioning/Environment              Frequency           Progress Toward Goals  OT Goals(current goals can now be  found in the care plan section)  Progress towards OT goals: Progressing toward goals  Acute Rehab OT Goals Patient Stated Goal: home ADL Goals Pt Will Perform Eating: with set-up;sitting Pt Will Transfer to Toilet: with min assist;ambulating;bedside commode Pt Will Perform Toileting - Clothing Manipulation and hygiene: with min assist;sit to/from stand  Plan      Co-evaluation    PT/OT/SLP Co-Evaluation/Treatment: Yes Reason for Co-Treatment: For patient/therapist safety PT goals addressed during session: Mobility/safety with mobility OT goals addressed during session: ADL's and self-care      AM-PAC PT "6 Clicks" Daily Activity     Outcome Measure   Help from another person eating meals?: A Little Help from another person taking care of personal grooming?: A Little Help from another person toileting, which includes using toliet, bedpan, or urinal?: A Lot Help from another person bathing (including washing, rinsing, drying)?: A Lot Help from another person to put on and taking off regular upper body clothing?: A Lot Help from another person to put on and taking off regular lower body clothing?: Total 6 Click Score: 13    End of Session    OT Visit Diagnosis: Unsteadiness on feet (R26.81);Pain;Other abnormalities of gait and mobility  (R26.89) Pain - Right/Left: Right Pain - part of body: Arm   Activity Tolerance Patient limited by pain   Patient Left in chair;with call bell/phone within reach   Nurse Communication          Time: [id number] OT Time Calculation (min): 29 min  Charges: OT General Charges $OT Visit: 1 Visit OT Treatments $Therapeutic Activity: 8-22 mins  Marica OtterMaryellen Amarii Amy, OTR/L [phone] 01/31/2017   Gwendola Hornaday 01/31/2017, 11:45 AM

## 2017-01-31 NOTE — Progress Notes (Signed)
CSW informed by patient's RN that patient is ready for discharge.  Patient ready for dc and needs prior insurance authorization. CSW staffed patient's case with CSW ChiropodistAssistant Director. CSW ChiropodistAssistant Director approved 5 day LOG for SNF.  CSW followed up with patient regarding discharge plans, PT still recommending SNF. Patient reported that he is unsure about his discharge plans. CSW informed patient that [facility] will accept patient with a 5 day LOG while they work on English as a second language teacherinsurance authorization, patient verbalized understanding. Patient reported that he was still thinking about his discharge plans and requested that CSW follow up with him. CSW agreed to follow up with patient.    CSW will follow up with patient to determine discharge plans.   Celso SickleKimberly Ruthia Person, [place]CSWA Clinical Social Worker [facility] Cell#: [phone]

## 2017-01-31 NOTE — Progress Notes (Signed)
   01/31/17 1400  OT Visit Information  Last OT Received On 01/31/17  Assistance Needed +1 (SPT)  History of Present Illness 52 year old male with history of recent hospitalization at at an outside facility with GI bleed secondary to gastritis from NSAID use for gout requiring blood transfusions [facility] with complaints of left knee pain. Pt being treated for gout.  Precautions  Precautions Fall  Pain Assessment  Pain Score 8  Pain Location L knee and R hand to elbow  Pain Descriptors / Indicators Tightness;Aching  Pain Intervention(s) Limited activity within patient's tolerance;Monitored during session;Premedicated before session;Repositioned  Cognition  Arousal/Alertness Awake/alert  Behavior During Therapy WFL for tasks assessed/performed  Overall Cognitive Status Within Functional Limits for tasks assessed  ADL  Toilet Transfer Minimal assistance;Stand-pivot  General ADL Comments returned to room with built up foam for self feeding with RUE.  Pt requested back to bed.  He was talking about potentially going home.  Pt has stairs to enter and shuffles feet for SPT.  Encouraged him to consider SNF  Bed Mobility  Sit to supine Mod assist  General bed mobility comments assist for bil LEs  Transfers  Equipment used Rolling walker (2 wheeled);Right platform walker  Sit to Stand Min assist  General transfer comment from built up chair  OT - End of Session  Activity Tolerance Patient limited by fatigue;Patient limited by pain  Patient left in bed;with call bell/phone within reach  OT Assessment/Plan  OT Visit Diagnosis Unsteadiness on feet (R26.81);Pain;Other abnormalities of gait and mobility (R26.89)  Pain - Right/Left Right  Pain - part of body Arm  Follow Up Recommendations SNF  OT Equipment 3 in 1 bedside commode (R platform walker)  AM-PAC OT "6 Clicks" Daily Activity Outcome Measure  Help from another person eating meals? 3  Help from  another person taking care of personal grooming? 3  Help from another person toileting, which includes using toliet, bedpan, or urinal? 2  Help from another person bathing (including washing, rinsing, drying)? 2  Help from another person to put on and taking off regular upper body clothing? 2  Help from another person to put on and taking off regular lower body clothing? 1  6 Click Score 13  ADL G Code Conversion CL  OT Goal Progression  Progress towards OT goals Progressing toward goals  OT Time Calculation  OT Start Time (ACUTE ONLY) 1315  OT Stop Time (ACUTE ONLY) 1323  OT Time Calculation (min) 8 min  OT General Charges  $OT Visit 1 Visit  OT Treatments  $Therapeutic Activity 8-22 mins  Marica OtterMaryellen Mackenna Kamer, OTR/L [phone] 01/31/2017

## 2017-01-31 NOTE — Clinical Social Work Placement (Signed)
Pt discharged and has agreed to accept SNF bed offer at [facility] for ST rehab- bed 122B, report [phone]  All information provided to facility via the HUB (CSW AD approved 5 day LOG as pt insurance Berkley Harveyauth is pending  Arranged [facility] transportation. Pt states he will notify girlfriend at DC.   See below for placement details CLINICAL SOCIAL WORK PLACEMENT  NOTE  Date:  01/31/2017  Patient Details  Name: Scott [name] MRN: [id number] Date of Birth: 03/21/1965  Clinical Social Work is seeking post-discharge placement for this patient at the Skilled  Nursing Facility level of care (*CSW will initial, date and re-position this form in  chart as items are completed):  Yes   Patient/family provided with [facility] Clinical Social Work Department's list of facilities offering this level of care within the geographic area requested by the patient (or if unable, by the patient's family).  Yes   Patient/family informed of their freedom to choose among providers that offer the needed level of care, that participate in Medicare, Medicaid or managed care program needed by the patient, have an available bed and are willing to accept the patient.  Yes   Patient/family informed of [facility]'s ownership interest in [facility] and [facility], as well as of the fact that they are under no obligation to receive care at these facilities.  PASRR submitted to EDS on 01/30/17     PASRR number received on 01/30/17     Existing PASRR number confirmed on       FL2 transmitted to all facilities in geographic area requested by pt/family on 01/30/17     FL2 transmitted to all facilities within larger geographic area on       Patient informed that his/her managed care company has contracts with or will negotiate with certain facilities, including the following:        Yes   Patient/family informed of bed offers received.  Patient chooses bed at [facility]     Physician  recommends and patient chooses bed at [facility]    Patient to be transferred to [facility] on 01/31/17.  Patient to be transferred to facility by PTAR     Patient family notified on 01/31/17 of transfer.  Name of family member notified:  patient states he will notify girlfriend     PHYSICIAN       Additional Comment:    _______________________________________________ Nelwyn SalisburyMeghan R Luisalberto Beegle, LCSW 01/31/2017, 3:28 PM

## 2017-01-31 NOTE — Discharge Summary (Signed)
[facility]  Physician Discharge Summary   Patient ID: Scott [name] MRN: [id number] DOB/AGE: 1965/04/14 52 y.o.  Admit date: 01/24/2017 Discharge date: 01/31/2017  PCP: Patient, No Pcp Per  DISCHARGE DIAGNOSES:  Principal Problem:   Gout attack Active Problems:   Hypokalemia   Gastritis   h/o recent GIB (gastrointestinal bleeding)   Hypomagnesemia   RECOMMENDATIONS FOR OUTPATIENT FOLLOW UP: 1. Patient to follow-up with his primary care provider in 1-2 weeks.  He should have a repeat uric acid level checked at that time. 2. Recommend checking CBC and basic metabolic panel in 1 week 3. Recommend follow-up with his PCP in 1 week.  His PCP [place] in [place].  DISCHARGE CONDITION: fair  Diet recommendation: Low-sodium  Filed Weights   01/24/17 1321  Weight: 101.2 kg (223 lb)    INITIAL HISTORY: 52 year old African-American male with a past medical history significant for recent hospitalization at an outside facility for GI bleed secondary to gastritis from NSAID use.  He was using NSAIDs for gout.  He required blood transfusion.  He presented to [facility] on 12/26 with complains of left knee pain.  Patient was thought to have acute gouty arthritis.  He was started on colchicine and prednisone.  He also found to have left lower lobar pneumonia.  Consultants: Orthopedics  Procedures: Arthrocentesis   HOSPITAL COURSE:   Acute gout flare Primarily involving the left knee and right wrist and elbow.  Patient did undergo arthrocentesis which revealed a white cell count of 61,500 with 97% neutrophils.  Intracellular monosodium urate crystals were noted.  Cultures have been negative so far.  Orthopedics was consulted.  Patient is currently on systemic steroids.  Symptoms are slowly improving.  Upper extremity Doppler study negative for DVT.  Seen by physical therapy.  They are recommending skilled nursing facility.  Symptoms have significantly  improved over the last 48 hours although patient continues to have difficulty mobilizing.  Physical therapy continues to recommend skilled nursing facility for rehab.  Patient is medically stable for discharge.  He is noted to be on allopurinol which was started just a few days prior to his hospitalization.  Uric acid level will need to be checked in 1-2 weeks.  Sepsis secondary to left lower lobe pneumonia Patient was febrile with a temperature of 102.3 F at admission.  He was also tachycardic tachypneic and had leukocytosis. Chest x-ray revealed left lower lobe pneumonia.  Strep pneumo and Legionella antigens have been negative.  Patient was started on antibiotics.  He has completed a course of azithromycin.  He will be discharged with 2 more days of cefpodoxime. Respiratory status is stable.  Saturating normal on room air.  Elevated blood pressure/essential hypertension Blood pressure is noted to be elevated.  Could be due to pain and steroids.  Continue amlodipine and hydralazine.  Monitor blood pressures closely at skilled nursing facility.  Acute kidney injury Likely related to medications.  Renal ultrasound was unremarkable.  Creatinine is stable.  Recommend checking labs in 1 week.   Macrocytic anemia Patient was recently hospitalized for GI bleed and had multiple blood transfusions.  Hemoglobin has been stable.  No evidence for overt bleeding.  History of gastritis with recent GI bleeding Hemoglobin stable.  Continue PPI.  Check labs as outpatient in 1 week.   Patient has improved.  Unfortunately he continues to require assistance and will need to go to a skilled nursing facility for short-term rehab.  Medically stable.   PERTINENT LABS:  The results of significant diagnostics from this hospitalization (including imaging, microbiology, ancillary and laboratory) are listed below for reference.    Microbiology: Recent Results (from the past 240 hour(s))  Body fluid culture      Status: None   Collection Time: 01/24/17 12:32 PM  Result Value Ref Range Status   Specimen Description SYNOVIAL LEFT KNEE  Final   Special Requests NONE  Final   Gram Stain   Final    ABUNDANT WBC PRESENT, PREDOMINANTLY PMN NO ORGANISMS SEEN Gram Stain Report Called to,Read Back By and Verified With: HUFF, J. RN @1405  ON 12.26.18 BY NMCCOY    Culture   Final    NO GROWTH 3 DAYS Performed at [facility], [address] [place], [place] [address]    Report Status 01/27/2017 FINAL  Final  Culture, blood (routine x 2)     Status: None   Collection Time: 01/24/17  3:18 PM  Result Value Ref Range Status   Specimen Description BLOOD LEFT HAND  Final   Special Requests   Final    BOTTLES DRAWN AEROBIC AND ANAEROBIC Blood Culture adequate volume   Culture   Final    NO GROWTH 5 DAYS Performed at [facility], [address] [place], [place] [address]    Report Status 01/29/2017 FINAL  Final  Culture, blood (routine x 2)     Status: None   Collection Time: 01/24/17  3:18 PM  Result Value Ref Range Status   Specimen Description BLOOD LEFT HAND  Final   Special Requests   Final    BOTTLES DRAWN AEROBIC AND ANAEROBIC Blood Culture adequate volume   Culture   Final    NO GROWTH 5 DAYS Performed at [facility], [address] [place], [place] [address]    Report Status 01/29/2017 FINAL  Final     Labs: Basic Metabolic Panel: Recent Labs  Lab 01/24/17 1425  01/26/17 0359 01/27/17 0426 01/28/17 0343 01/29/17 0439 01/31/17 0404  NA 138   < > 136 139 140 135 136  K 3.0*   < > 4.8 4.2 4.0 4.1 3.8  CL 92*   < > 100* 105 107 103 102  CO2 31   < > 25 25 25 23 26   GLUCOSE 153*   < > 187* 159* 150* 147* 163*  BUN 16   < > 31* 40* 34* 29* 38*  CREATININE 0.92   < > 1.59* 1.72* 1.49* 1.34* 1.50*  CALCIUM 6.7*   < > 7.1* 8.0* 8.6* 8.8* 8.8*  MG 0.8*  --  1.3* 1.8  --   --   --    < > = values in this interval not displayed.   Liver Function Tests: Recent Labs   Lab 01/26/17 0359  AST 44*  ALT 22  ALKPHOS 92  BILITOT 1.3*  PROT 6.5  ALBUMIN 2.5*   CBC: Recent Labs  Lab 01/25/17 0353 01/26/17 0359 01/27/17 0426 01/28/17 0343 01/29/17 0439  WBC 16.3* 16.6* 17.1* 14.0* 14.9*  NEUTROABS  --  13.9*  --   --   --   HGB 8.1* 7.5* 7.2* 7.7* 7.8*  HCT 24.0* 22.0*  23.1* 21.0* 23.4* 22.5*  MCV 104.3* 104.3* 102.9* 103.5* 101.4*  PLT 254 234 287 348 380    CBG: Recent Labs  Lab 01/24/17 1249  GLUCAP 134*     IMAGING STUDIES Dg Chest 2 View  Result Date: 01/26/2017 CLINICAL DATA:  Dyspnea and  shortness of breath.  Generalized pain. EXAM: CHEST  2 VIEW COMPARISON:  01/24/2017 FINDINGS: Normal heart size. No pleural effusion or edema. Subtle asymmetric opacity within the left lower lobe is identified and may represent early pneumonia. Right lung appears clear. IMPRESSION: 1. Suspect early pneumonia within the left lower lobe. Electronically Signed   By: Signa Kell M.D.   On: 01/26/2017 09:08   Dg Chest 2 View  Result Date: 01/24/2017 CLINICAL DATA:  High fever. EXAM: CHEST  2 VIEW COMPARISON:  None. FINDINGS: Heart size is normal. Mediastinal shadows are normal. Abnormal density on the lateral view overlying the lower spine is consistent with lower lobe pneumonia. Side indeterminate based on the frontal view. No effusions. No acute bone finding. IMPRESSION: Lower lobe pneumonia visible on the lateral view, side indeterminate. Electronically Signed   By: Paulina Fusi M.D.   On: 01/24/2017 15:54   Dg Wrist 2 Views Right  Result Date: 01/25/2017 CLINICAL DATA:  Radial side swelling.  History of gout. EXAM: RIGHT WRIST - 2 VIEW COMPARISON:  None. FINDINGS: No evidence of fracture. No visible bone erosion. 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 IMPRESSION: Indistinct hyperdensity dorsal to the carpal bones, which could be consistent with gout tophus given the history. Electronically Signed    By: Paulina Fusi M.D.   On: 01/25/2017 10:20   US Renal  Result Date: 01/28/2017 CLINICAL DATA:  Acute kidney injury. EXAM: RENAL / URINARY TRACT ULTRASOUND COMPLETE COMPARISON:  None. FINDINGS: Right Kidney: Length: 11.1 cm. Echogenicity within normal limits. No mass or hydronephrosis visualized. Left Kidney: Length: 11.4 cm. Echogenicity within normal limits. No mass or hydronephrosis visualized. Bladder: Appears normal for degree of bladder distention. IMPRESSION: Normal renal ultrasound. Electronically Signed   By: Obie Dredge M.D.   On: 01/28/2017 10:05   Dg Knee Complete 4 Views Left  Result Date: 01/25/2017 CLINICAL DATA:  Pain and swelling of the left knee. EXAM: LEFT KNEE - COMPLETE 4+ VIEW COMPARISON:  None. FINDINGS: Large knee joint effusion. Previous tunneled ACL repair. Hardware appears unremarkable. Degenerative arthritis with joint space narrowing and osteophytes, worse in the medial compartment. Patellofemoral osteoarthritis as well. IMPRESSION: Large knee joint effusion. Previous tunneled ACL repair. Osteoarthritis, more advanced in the medial compartment and patellofemoral joint and lateral compartment. Electronically Signed   By: Paulina Fusi M.D.   On: 01/25/2017 10:19    DISCHARGE EXAMINATION: Vitals:   01/30/17 0522 01/30/17 1339 01/30/17 2153 01/31/17 0605  BP: (!) 163/94 137/87 (!) 160/75 (!) 148/81  Pulse: 80 95 98 89  Resp: 16 18 18 18   Temp: 98.4 F (36.9 C) 98.4 F (36.9 C) 97.7 F (36.5 C) 98.8 F (37.1 C)  TempSrc: Oral Oral Oral Oral  SpO2: 99% 95% 97% 97%  Weight:      Height:       General appearance: alert, cooperative, appears stated age and no distress Resp: clear to auscultation bilaterally Cardio: regular rate and rhythm, S1, S2 normal, no murmur, click, rub or gallop GI: soft, non-tender; bowel sounds normal; no masses,  no organomegaly Improved swelling and mobility of the left knee although remains restricted.  Improve mobility of right  wrist and right elbow although remains restricted.  DISPOSITION: SNF  Discharge Instructions    Call MD for:  extreme fatigue   Complete by:  As directed    Call MD for:  persistant dizziness or light-headedness   Complete by:  As directed  Call MD for:  persistant nausea and vomiting   Complete by:  As directed    Call MD for:  severe uncontrolled pain   Complete by:  As directed    Call MD for:  temperature >100.4   Complete by:  As directed    Discharge instructions   Complete by:  As directed    Please review instructions on the discharge summary.  You were cared for by a hospitalist during your hospital stay. If you have any questions about your discharge medications or the care you received while you were in the hospital after you are discharged, you can call the unit and asked to speak with the hospitalist on call if the hospitalist that took care of you is not available. Once you are discharged, your primary care physician will handle any further medical issues. Please note that NO REFILLS for any discharge medications will be authorized once you are discharged, as it is imperative that you return to your primary care physician (or establish a relationship with a primary care physician if you do not have one) for your aftercare needs so that they can reassess your need for medications and monitor your lab values. If you do not have a primary care physician, you can call [phone] for a physician referral.   Increase activity slowly   Complete by:  As directed         Allergies as of 01/31/2017      Reactions   Ace Inhibitors Swelling   Facial swelling      Medication List    STOP taking these medications   indomethacin 50 MG capsule Commonly known as:  INDOCIN     TAKE these medications   acetaminophen 500 MG tablet Commonly known as:  TYLENOL Take 500 mg by mouth every 6 (six) hours as needed for mild pain.   allopurinol 100 MG tablet Commonly known as:   ZYLOPRIM Take 100 mg by mouth daily.   amLODipine 10 MG tablet Commonly known as:  NORVASC Take 10 mg by mouth daily.   cefpodoxime 200 MG tablet Commonly known as:  VANTIN Take 1 tablet (200 mg total) by mouth every 12 (twelve) hours for 3 days.   furosemide 20 MG tablet Commonly known as:  LASIX Take 20 mg by mouth daily as needed for edema.   hydrALAZINE 50 MG tablet Commonly known as:  APRESOLINE Take 50 mg by mouth 3 (three) times daily.   methocarbamol 500 MG tablet Commonly known as:  ROBAXIN Take 1 tablet (500 mg total) by mouth every 6 (six) hours as needed for muscle spasms.   multivitamin with minerals Tabs tablet Take 1 tablet by mouth daily.   omeprazole 20 MG capsule Commonly known as:  PRILOSEC Take 20 mg by mouth 2 (two) times daily before a meal.   oxyCODONE 5 MG immediate release tablet Commonly known as:  Oxy IR/ROXICODONE Take 1-2 tablets (5-10 mg total) by mouth every 4 (four) hours as needed for severe pain or breakthrough pain.   predniSONE 20 MG tablet Commonly known as:  DELTASONE Take 3 tablets once daily for 2 days, then take 2 tablets once daily for 4 days, then take 1 tablet once daily for 4 days, then STOP.          TOTAL DISCHARGE TIME: 35 minutes  Osvaldo Shipper  [facility] Pager [phone]  01/31/2017, 12:45 PM

## 2017-01-31 NOTE — Telephone Encounter (Signed)
RX faxed to [facility] @ [phone], phone number [phone] 

## 2017-01-31 NOTE — Progress Notes (Signed)
CSW contacted  [facility] to inquire about ability to offer patient a bed. Staff member Marchelle Folksmanda agreed to review patient's referral and contact CSW with an update.   CSW spoke with patient at bedside and provided bed offers. CSW inquired about patient's back up plan if his insurance does not cover SNF, patient reported that he will dc home with home health services. Patient reported that he is going to work with PT today and that will influence his discharge plans. CSW agreed to follow up with patient and continue to assist with discharge planning.  Celso SickleKimberly Jadelin Eng, [place] Clinical Social Worker [facility] Cell#: [phone]

## 2017-01-31 NOTE — Progress Notes (Signed)
Physical Therapy Treatment Patient Details Name: Scott EvenerKurk Figeroa MRN: [id number] DOB: 12/09/1965 Today's Date: 01/31/2017    History of Present Illness 52 year old male with history of recent hospitalization at at an outside facility with GI bleed secondary to gastritis from NSAID use for gout requiring blood transfusions [facility]/2017 with complaints of left knee pain. Pt being treated for gout.    PT Comments    Progressing slowly with mobility. Pt was able to get to EOB and stand with Min guard assist. He required Min assist +2 for safety for ambulation. He was only able to tolerate walking ~3 feet in the room with a R PFRW. Unfortunately, pt hasn't progressed enough to safely d/c home. Continue to recommend ST SNF. Will continue to follow.     Follow Up Recommendations  SNF     Equipment Recommendations  Rolling walker with (R platform); 3in1   Recommendations for Other Services       Precautions / Restrictions Precautions Precautions: Fall Restrictions Weight Bearing Restrictions: No    Mobility  Bed Mobility Overal bed mobility: Needs Assistance Bed Mobility: Supine to Sit     Supine to sit: Min guard;HOB elevated     General bed mobility comments: for safety; HOB raised slightly and extra time  Transfers Overall transfer level: Needs assistance Equipment used: Rolling walker (2 wheeled);Right platform walker Transfers: Sit to/from Stand;Stand Pivot Transfers Sit to Stand: Min guard Stand pivot transfers: Min assist       General transfer comment: assist to turn walker; min guard for safety with sit to stand-increased time. VCs safety, technique, hand placement. Sit to stand x 3. Stand pivot, bed to recliner.   Ambulation/Gait Ambulation/Gait assistance: Min assist;+2 safety/equipment Ambulation Distance (Feet): 3 Feet Assistive device: Right platform walker Gait Pattern/deviations: Step-to pattern;Trunk flexed     General  Gait Details: Pt continutes to have significant pain with WBing which is limiting ambulation progression. Cues for upright trunk posture. Pt fatigues quickly.    Stairs            Wheelchair Mobility    Modified Rankin (Stroke Patients Only)       Balance                                            Cognition Arousal/Alertness: Awake/alert Behavior During Therapy: WFL for tasks assessed/performed Overall Cognitive Status: Within Functional Limits for tasks assessed                                        Exercises      General Comments        Pertinent Vitals/Pain Pain Assessment: 0-10 Pain Score: 8  Faces Pain Scale: Hurts even more Pain Location: L knee and R hand to elbow Pain Descriptors / Indicators: Pins and needles;Aching;Sore;Discomfort;Grimacing Pain Intervention(s): Premedicated before session;Limited activity within patient's tolerance;Repositioned    Home Living                      Prior Function            PT Goals (current goals can now be found in the care plan section) Acute Rehab PT Goals Patient Stated Goal: home Progress towards PT goals: Progressing toward goals  Frequency    Min 3X/week      PT Plan Current plan remains appropriate    Co-evaluation   Reason for Co-Treatment: For patient/therapist safety PT goals addressed during session: Mobility/safety with mobility OT goals addressed during session: ADL's and self-care      AM-PAC PT "6 Clicks" Daily Activity  Outcome Measure  Difficulty turning over in bed (including adjusting bedclothes, sheets and blankets)?: A Lot Difficulty moving from lying on back to sitting on the side of the bed? : A Lot Difficulty sitting down on and standing up from a chair with arms (e.g., wheelchair, bedside commode, etc,.)?: A Lot Help needed moving to and from a bed to chair (including a wheelchair)?: A Little Help needed walking in hospital  room?: A Lot Help needed climbing 3-5 steps with a railing? : Total 6 Click Score: 12    End of Session Equipment Utilized During Treatment: Gait belt Activity Tolerance: Patient limited by pain;Patient limited by fatigue Patient left: in chair;with call bell/phone within reach   PT Visit Diagnosis: Difficulty in walking, not elsewhere classified (R26.2);Pain;Muscle weakness (generalized) (M62.81) Pain - part of body: (R lower arm/hand/wrist; L knee)     Time: 7829-5621 PT Time Calculation (min) (ACUTE ONLY): 23 min  Charges:  $Gait Training: 8-22 mins                    G Codes:          Rebeca Alert, MPT Pager: [phone]

## 2017-02-01 ENCOUNTER — Non-Acute Institutional Stay (SKILLED_NURSING_FACILITY): Payer: 59 | Admitting: Adult Health

## 2017-02-01 ENCOUNTER — Encounter: Payer: Self-pay | Admitting: Adult Health

## 2017-02-01 DIAGNOSIS — I10 Essential (primary) hypertension: Secondary | ICD-10-CM | POA: Diagnosis not present

## 2017-02-01 DIAGNOSIS — D649 Anemia, unspecified: Secondary | ICD-10-CM | POA: Diagnosis not present

## 2017-02-01 DIAGNOSIS — M109 Gout, unspecified: Secondary | ICD-10-CM

## 2017-02-01 DIAGNOSIS — K922 Gastrointestinal hemorrhage, unspecified: Secondary | ICD-10-CM | POA: Diagnosis not present

## 2017-02-01 DIAGNOSIS — J181 Lobar pneumonia, unspecified organism: Secondary | ICD-10-CM | POA: Diagnosis not present

## 2017-02-01 DIAGNOSIS — K2970 Gastritis, unspecified, without bleeding: Secondary | ICD-10-CM | POA: Diagnosis not present

## 2017-02-01 DIAGNOSIS — J189 Pneumonia, unspecified organism: Secondary | ICD-10-CM

## 2017-02-01 NOTE — Progress Notes (Addendum)
Location:   [facility] Room Number: 122 B Place of Service:  SNF (31)   CODE STATUS: Full Code  Allergies  Allergen Reactions  . Ace Inhibitors Swelling    Facial swelling    Chief Complaint  Patient presents with  . Hospitalization Follow-up    Hospital follow up    HPI:  He had been hospitalized from 01-24-17 through 02-01-16.  He did have acute gouty attack of right wrist elbow and left knee. He did undergo arthrocentesis. He is on a prednisone taper for the next week. He also had left lower lobe pneumonia; he will need to complete his vantin. He is here for short term rehab with his goal to return back home. He is having uncontrolled pain; in his right hand and left knee.   Past Medical History:  Diagnosis Date  . Gout   . Hypertension     History reviewed. No pertinent surgical history.  Social History   Socioeconomic History  . Marital status: Divorced    Spouse name: Not on file  . Number of children: Not on file  . Years of education: Not on file  . Highest education level: Not on file  Social Needs  . Financial resource strain: Not on file  . Food insecurity - worry: Not on file  . Food insecurity - inability: Not on file  . Transportation needs - medical: Not on file  . Transportation needs - non-medical: Not on file  Occupational History  . Not on file  Tobacco Use  . Smoking status: Never Smoker  . Smokeless tobacco: Never Used  Substance and Sexual Activity  . Alcohol use: No    Frequency: Never  . Drug use: No  . Sexual activity: Not on file  Other Topics Concern  . Not on file  Social History Narrative  . Not on file   History reviewed. No pertinent family history.    VITAL SIGNS BP (!) 178/80   Pulse (!) 102   Temp 98.8 F (37.1 C)   Resp 18   Ht 6\' 5"  (1.956 m)   Wt 231 lb (104.8 kg)   SpO2 97%   BMI 27.39 kg/m    Recheck 150/90  Outpatient Encounter Medications as of 02/01/2017  Medication Sig  . acetaminophen  (TYLENOL) 500 MG tablet Take 500 mg by mouth every 6 (six) hours as needed for mild pain.  Marland Kitchen allopurinol (ZYLOPRIM) 100 MG tablet Take 100 mg by mouth daily.  Marland Kitchen amLODipine (NORVASC) 10 MG tablet Take 10 mg by mouth daily.  . cefpodoxime (VANTIN) 200 MG tablet Take 1 tablet (200 mg total) by mouth every 12 (twelve) hours for 3 days.  . cloNIDine (CATAPRES) 0.1 MG tablet Take 0.1 mg by mouth every 8 (eight) hours as needed. For systolic BP > or = 180  . furosemide (LASIX) 20 MG tablet Take 20 mg by mouth daily as needed for edema.  . hydrALAZINE (APRESOLINE) 50 MG tablet Take 50 mg by mouth 3 (three) times daily.  . methocarbamol (ROBAXIN) 500 MG tablet Take 1 tablet (500 mg total) by mouth every 6 (six) hours as needed for muscle spasms.  . Multiple Vitamin (MULTIVITAMIN WITH MINERALS) TABS tablet Take 1 tablet by mouth daily.  Marland Kitchen omeprazole (PRILOSEC) 20 MG capsule Take 20 mg by mouth 2 (two) times daily before a meal.  . oxyCODONE (OXY IR/ROXICODONE) 5 MG immediate release tablet Take 1-2 tablets (5-10 mg total) by mouth every 4 (four) hours as  needed for severe pain or breakthrough pain.  . predniSONE (DELTASONE) 20 MG tablet Take 3 tablets once daily for 2 days, then take 2 tablets once daily for 4 days, then take 1 tablet once daily for 4 days, then STOP.   No facility-administered encounter medications on file as of 02/01/2017.      SIGNIFICANT DIAGNOSTIC EXAMS  TODAY:   01-24-17: chest x-ray: Lower lobe pneumonia visible on the lateral view, side indeterminate.   01-25-17: left knee x-ray: Large knee joint effusion. Previous tunneled ACL repair. Osteoarthritis, more advanced in the medial compartment and patellofemoral joint and lateral compartment.   01-25-17: right wrist x-ray: Indistinct hyperdensity dorsal to the carpal bones, which could be consistent with gout tophus given the history.  01-26-17: chest x-ray: Suspect early pneumonia within the left lower lobe.   01-28-17: renal  ultrasound: Normal renal ultrasound.   LABS REVIEWED:   01-24-17: wbc 17.4; hgb 8.6; hct 24.9; mcv 104.2; plt 284; glucose 167; bun 18; creat 1.06 ;k+ 2.4; na++ 137; ca 7.0; ast 49; total bili 2.0; albumin 3.4; blood culture: no growth; uric acid 8.7; sed rate 84; mag 0.7 01-26-17: wbc 16.6; hgb 7.5; hct 22.0; mcv 104.3; plt 234; glucose 187; bun 31; creat 1.59; k+ 4.8; na++ 136; ca 7.1; ast 44; total bili 1.3; albumin 2.5; mag 13; vit B 12: 358 01-29-17: wbc 14.9; hgb 7.8; hct 22.5; mcv 101.4; plt 380; glucose 147; bun 29; creat 1.34; k+ 4.1; na ++ 135; ca 8.8 01-31-17: glucose 163; bun 38; creat 1.50 ;k+ 3.8; na++ 136; ca 8.8   Review of Systems  Constitutional: Negative for malaise/fatigue.  Respiratory: Negative for cough and shortness of breath.   Cardiovascular: Negative for chest pain, palpitations and leg swelling.  Gastrointestinal: Negative for abdominal pain, constipation and heartburn.  Musculoskeletal: Positive for joint pain. Negative for back pain and myalgias.       Significant right hand and left knee pain.both are swollen; warm and painful to touch  Skin: Negative.   Neurological: Negative for dizziness.  Psychiatric/Behavioral: The patient is not nervous/anxious.     Physical Exam  Constitutional: He is oriented to person, place, and time. He appears well-developed and well-nourished. No distress.  Neck: Normal range of motion. No thyromegaly present.  Cardiovascular: Normal rate, regular rhythm, normal heart sounds and intact distal pulses.  Pulmonary/Chest: Effort normal and breath sounds normal. No respiratory distress.  Abdominal: Soft. Bowel sounds are normal. He exhibits no distension. There is no tenderness.  Musculoskeletal: He exhibits edema.  Is able to move all extremities Has swelling in right hand and left knee; they are hot and tender to touch.   Lymphadenopathy:    He has no cervical adenopathy.  Neurological: He is alert and oriented to person, place,  and time.  Skin: Skin is warm and dry. He is not diaphoretic.  Psychiatric: He has a normal mood and affect.    ASSESSMENT/ PLAN:  TODAY:   1. Hypertension: stable b/p 150/90: will continue norvasc 10 mg daily apresoline 50 mg three times; clonidine 0.1 mg every 8 hours as needed for systolic >=180.  2.  Gastritis: has had recent GI bleed: stable will continue prilosec 20 mg daily   3. Gouty attack: right hand/writst/elbow and left knee: is without change: will increase allopurinol to 200 mg daily will change prednisone taper to the following: 60 mg daily for 5 days; 40 mg daily for 5 days; 20 mg daily for 5 days then stop. Has oxycodone  5 or 10 mg every 4 hours as needed for pain  Not using colchicine due to his recent GI issues.   4.  Left lower lobe pneumonia: improving will complete vantan 200 mg twice daily for 3 days and will monitor   5. Anemia: without change hgb 7.8: will continue to monitor   6. Bilateral lower extremity edema: stable will continue lasix 20 mg daily for weight gain for 3 pounds or more.   Will check cbc; bmp mag level   MD is aware of resident's narcotic use and is in agreement with current plan of care. We will attempt to wean resident as apropriate   Synthia Innocent NP [facility]  Contact [phone] Monday through Friday 8am- 5pm  After hours call [phone]

## 2017-02-02 ENCOUNTER — Non-Acute Institutional Stay (SKILLED_NURSING_FACILITY): Payer: 59 | Admitting: Adult Health

## 2017-02-02 ENCOUNTER — Encounter: Payer: Self-pay | Admitting: Adult Health

## 2017-02-02 DIAGNOSIS — M109 Gout, unspecified: Secondary | ICD-10-CM | POA: Diagnosis not present

## 2017-02-02 NOTE — Progress Notes (Signed)
Location:   [facility] Room Number: 122 B Place of Service:  SNF (31)   CODE STATUS: Full Code  Allergies  Allergen Reactions  . Ace Inhibitors Swelling    Facial swelling    Chief Complaint  Patient presents with  . Acute Visit    Care Plan Meeting    HPI:  We have come together for his care plan meeting. He is complaining of increased left knee swelling; but has less right hand swelling present; there is still mild warmth present. He will need to go up 3 steps to get into his house; and has 2 stories that he will need to navigate this. He can use the first floor for his bedroom. He lives with his significant other. He will most likely require a 3:1 commode. He is still having pain management issues.   Past Medical History:  Diagnosis Date  . Gout   . Hypertension     History reviewed. No pertinent surgical history.  Social History   Socioeconomic History  . Marital status: Divorced    Spouse name: Not on file  . Number of children: Not on file  . Years of education: Not on file  . Highest education level: Not on file  Social Needs  . Financial resource strain: Not on file  . Food insecurity - worry: Not on file  . Food insecurity - inability: Not on file  . Transportation needs - medical: Not on file  . Transportation needs - non-medical: Not on file  Occupational History  . Not on file  Tobacco Use  . Smoking status: Never Smoker  . Smokeless tobacco: Never Used  Substance and Sexual Activity  . Alcohol use: No    Frequency: Never  . Drug use: No  . Sexual activity: Not on file  Other Topics Concern  . Not on file  Social History Narrative  . Not on file   History reviewed. No pertinent family history.    VITAL SIGNS BP 120/60   Pulse 92   Temp 98.8 F (37.1 C)   Resp 18   Ht 6\' 5"  (1.956 m)   Wt 232 lb 1.6 oz (105.3 kg)   SpO2 97%   BMI 27.52 kg/m   Outpatient Encounter Medications as of 02/02/2017  Medication Sig  .  acetaminophen (TYLENOL) 500 MG tablet Take 500 mg by mouth every 6 (six) hours as needed for mild pain.  Marland Kitchen allopurinol (ZYLOPRIM) 100 MG tablet Take 200 mg by mouth daily.   Marland Kitchen amLODipine (NORVASC) 10 MG tablet Take 10 mg by mouth daily.  . [EXPIRED] cefpodoxime (VANTIN) 200 MG tablet Take 1 tablet (200 mg total) by mouth every 12 (twelve) hours for 3 days.  . cloNIDine (CATAPRES) 0.1 MG tablet Take 0.1 mg by mouth every 8 (eight) hours as needed. For systolic BP > or = 180  . colchicine 0.6 MG tablet Starting 02-04-17 - Give 1 tablet by mouth three times a day every 3 days 02/07/17 - 02/10/17 Give 1 tablet by mouth two times a day every 3 days until 02/10/17 02/11/17 Give 1 tablet by mouth one time a day routinely  . furosemide (LASIX) 40 MG tablet Take 40 mg by mouth 2 (two) times daily.   . hydrALAZINE (APRESOLINE) 50 MG tablet Take 50 mg by mouth 3 (three) times daily.  . methocarbamol (ROBAXIN) 500 MG tablet Take 1 tablet (500 mg total) by mouth every 6 (six) hours as needed for muscle spasms.  Marland Kitchen  Multiple Vitamin (MULTIVITAMIN WITH MINERALS) TABS tablet Take 1 tablet by mouth daily.  Marland Kitchen. omeprazole (PRILOSEC) 20 MG capsule Take 20 mg by mouth 2 (two) times daily before a meal.  . Oxycodone HCl 10 MG TABS Take 10 mg by mouth every 4 (four) hours.  . potassium chloride SA (K-DUR,KLOR-CON) 20 MEQ tablet Take 20 mEq by mouth daily.  . predniSONE (DELTASONE) 20 MG tablet Starting 02/02/17 - 02/07/17 Give 60 mg by mouth one time daily x 5 days 02/07/17 - 02/12/17 Give 40 mg by mouth one time daily x 5 days 02/12/17 - 02/17/17 Give 20 mg by mouth one time daily x 5 days  . [DISCONTINUED] predniSONE (DELTASONE) 20 MG tablet Take 3 tablets once daily for 2 days, then take 2 tablets once daily for 4 days, then take 1 tablet once daily for 4 days, then STOP. (Patient not taking: Reported on 02/05/2017)  . [DISCONTINUED] oxyCODONE (OXY IR/ROXICODONE) 5 MG immediate release tablet Take 1-2 tablets (5-10 mg total) by mouth  every 4 (four) hours as needed for severe pain or breakthrough pain. (Patient not taking: Reported on 02/08/2017)   No facility-administered encounter medications on file as of 02/02/2017.      SIGNIFICANT DIAGNOSTIC EXAMS  TODAY: PREVIOUS   01-24-17: chest x-ray: Lower lobe pneumonia visible on the lateral view, side indeterminate.   01-25-17: left knee x-ray: Large knee joint effusion. Previous tunneled ACL repair. Osteoarthritis, more advanced in the medial compartment and patellofemoral joint and lateral compartment.   01-25-17: right wrist x-ray: Indistinct hyperdensity dorsal to the carpal bones, which could be consistent with gout tophus given the history.  01-26-17: chest x-ray: Suspect early pneumonia within the left lower lobe.   01-28-17: renal ultrasound: Normal renal ultrasound.    NO NEW EXAMS  LABS REVIEWED: PREVIOUS  01-24-17: wbc 17.4; hgb 8.6; hct 24.9; mcv 104.2; plt 284; glucose 167; bun 18; creat 1.06 ;k+ 2.4; na++ 137; ca 7.0; ast 49; total bili 2.0; albumin 3.4; blood culture: no growth; uric acid 8.7; sed rate 84; mag 0.7 01-26-17: wbc 16.6; hgb 7.5; hct 22.0; mcv 104.3; plt 234; glucose 187; bun 31; creat 1.59; k+ 4.8; na++ 136; ca 7.1; ast 44; total bili 1.3; albumin 2.5; mag 13; vit B 12: 358 01-29-17: wbc 14.9; hgb 7.8; hct 22.5; mcv 101.4; plt 380; glucose 147; bun 29; creat 1.34; k+ 4.1; na ++ 135; ca 8.8 01-31-17: glucose 163; bun 38; creat 1.50 ;k+ 3.8; na++ 136; ca 8.8   NO NEW LABS    Review of Systems  Constitutional: Negative for malaise/fatigue.  Respiratory: Negative for cough and shortness of breath.   Cardiovascular: Negative for chest pain, palpitations and leg swelling.  Gastrointestinal: Negative for abdominal pain, constipation and heartburn.  Musculoskeletal: Positive for joint pain. Negative for back pain and myalgias.       Has pain and swelling in left knee and right hand   Skin: Negative.   Neurological: Negative for dizziness.    Psychiatric/Behavioral: The patient is not nervous/anxious.     Physical Exam  Constitutional: He is oriented to person, place, and time. He appears well-developed and well-nourished. No distress.  Neck: No thyromegaly present.  Cardiovascular: Normal rate, regular rhythm, normal heart sounds and intact distal pulses.  Pulmonary/Chest: Effort normal and breath sounds normal. No respiratory distress.  Abdominal: Soft. Bowel sounds are normal. He exhibits no distension. There is no tenderness.  Musculoskeletal: He exhibits edema.  Is able to move all extremities Has less right  hand swelling and right knee swelling.  Has 1+ lower extremity edema .    Lymphadenopathy:    He has no cervical adenopathy.  Neurological: He is alert and oriented to person, place, and time.  Skin: Skin is warm and dry. He is not diaphoretic.  Psychiatric: He has a normal mood and affect.    ASSESSMENT/ PLAN:  TODAY:   1. Gouty attack: right hand/writst/elbow and left knee: is without change: will continue  allopurinol  200 mg daily will complete prednisone taper; will change oxycodone 0.6 mg three times daily for 3 days then twice daily for 3 days then daily; will change oxycodone to 10 mg every 4 hours routinely     MD is aware of resident's narcotic use and is in agreement with current plan of care. We will attempt to wean resident as apropriate    Synthia Innocent NP [facility]  Contact [phone] Monday through Friday 8am- 5pm  After hours call [phone]

## 2017-02-05 ENCOUNTER — Non-Acute Institutional Stay (SKILLED_NURSING_FACILITY): Payer: 59 | Admitting: Adult Health

## 2017-02-05 ENCOUNTER — Encounter: Payer: Self-pay | Admitting: Adult Health

## 2017-02-05 DIAGNOSIS — M109 Gout, unspecified: Secondary | ICD-10-CM

## 2017-02-05 DIAGNOSIS — R600 Localized edema: Secondary | ICD-10-CM | POA: Diagnosis not present

## 2017-02-05 NOTE — Progress Notes (Signed)
Location:   [facility] Room Number: 122 B Place of Service:  SNF (31)   CODE STATUS: Full Code  Allergies  Allergen Reactions  . Ace Inhibitors Swelling    Facial swelling    Chief Complaint  Patient presents with  . Acute Visit    Increased edema bilateral thighs    HPI:  He is experiencing worsening edema up to his thighs. He denies any cough; shortness of breath; or chest pain. He is able to lay flat in the bed. He is presently on a prednisone taper. The swelling in his hand and knee is greatly reduced; he states that the pain is improving.    Past Medical History:  Diagnosis Date  . Gout   . Hypertension     History reviewed. No pertinent surgical history.  Social History   Socioeconomic History  . Marital status: Divorced    Spouse name: Not on file  . Number of children: Not on file  . Years of education: Not on file  . Highest education level: Not on file  Social Needs  . Financial resource strain: Not on file  . Food insecurity - worry: Not on file  . Food insecurity - inability: Not on file  . Transportation needs - medical: Not on file  . Transportation needs - non-medical: Not on file  Occupational History  . Not on file  Tobacco Use  . Smoking status: Never Smoker  . Smokeless tobacco: Never Used  Substance and Sexual Activity  . Alcohol use: No    Frequency: Never  . Drug use: No  . Sexual activity: Not on file  Other Topics Concern  . Not on file  Social History Narrative  . Not on file   History reviewed. No pertinent family history.    VITAL SIGNS BP 128/74   Pulse 80   Temp 98.1 F (36.7 C)   Resp 18   Ht 6\' 5"  (1.956 m)   Wt 236 lb (107 kg)   SpO2 97%   BMI 27.99 kg/m   Outpatient Encounter Medications as of 02/05/2017  Medication Sig  . acetaminophen (TYLENOL) 500 MG tablet Take 500 mg by mouth every 6 (six) hours as needed for mild pain.  Marland Kitchen allopurinol (ZYLOPRIM) 100 MG tablet Take 200 mg by mouth daily.     Marland Kitchen amLODipine (NORVASC) 10 MG tablet Take 10 mg by mouth daily.  . cloNIDine (CATAPRES) 0.1 MG tablet Take 0.1 mg by mouth every 8 (eight) hours as needed. For systolic BP > or = 180  . colchicine 0.6 MG tablet Starting 02-04-17 - Give 1 tablet by mouth three times a day every 3 days 02/07/17 - 02/10/17 Give 1 tablet by mouth two times a day every 3 days until 02/10/17 02/11/17 Give 1 tablet by mouth one time a day routinely  . furosemide (LASIX) 20 MG tablet Take 20 mg by mouth daily as needed for edema.  . hydrALAZINE (APRESOLINE) 50 MG tablet Take 50 mg by mouth 3 (three) times daily.  . methocarbamol (ROBAXIN) 500 MG tablet Take 1 tablet (500 mg total) by mouth every 6 (six) hours as needed for muscle spasms.  . Multiple Vitamin (MULTIVITAMIN WITH MINERALS) TABS tablet Take 1 tablet by mouth daily.  Marland Kitchen omeprazole (PRILOSEC) 20 MG capsule Take 20 mg by mouth 2 (two) times daily before a meal.  . oxyCODONE (OXY IR/ROXICODONE) 5 MG immediate release tablet Take 1-2 tablets (5-10 mg total) by mouth every 4 (four) hours  as needed for severe pain or breakthrough pain.  . predniSONE (DELTASONE) 20 MG tablet Starting 02/02/17 - 02/07/17 Give 60 mg by mouth one time daily x 5 days 02/07/17 - 02/12/17 Give 40 mg by mouth one time daily x 5 days 02/12/17 - 02/17/17 Give 20 mg by mouth one time daily x 5 days  . [DISCONTINUED] predniSONE (DELTASONE) 20 MG tablet Take 3 tablets once daily for 2 days, then take 2 tablets once daily for 4 days, then take 1 tablet once daily for 4 days, then STOP. (Patient not taking: Reported on 02/05/2017)   No facility-administered encounter medications on file as of 02/05/2017.      SIGNIFICANT DIAGNOSTIC EXAMS   PREVIOUS   01-24-17: chest x-ray: Lower lobe pneumonia visible on the lateral view, side indeterminate.   01-25-17: left knee x-ray: Large knee joint effusion. Previous tunneled ACL repair. Osteoarthritis, more advanced in the medial compartment and patellofemoral joint and  lateral compartment.   01-25-17: right wrist x-ray: Indistinct hyperdensity dorsal to the carpal bones, which could be consistent with gout tophus given the history.  01-26-17: chest x-ray: Suspect early pneumonia within the left lower lobe.   01-28-17: renal ultrasound: Normal renal ultrasound.    NO NEW EXAMS  LABS REVIEWED: PREVIOUS  01-24-17: wbc 17.4; hgb 8.6; hct 24.9; mcv 104.2; plt 284; glucose 167; bun 18; creat 1.06 ;k+ 2.4; na++ 137; ca 7.0; ast 49; total bili 2.0; albumin 3.4; blood culture: no growth; uric acid 8.7; sed rate 84; mag 0.7 01-26-17: wbc 16.6; hgb 7.5; hct 22.0; mcv 104.3; plt 234; glucose 187; bun 31; creat 1.59; k+ 4.8; na++ 136; ca 7.1; ast 44; total bili 1.3; albumin 2.5; mag 13; vit B 12: 358 01-29-17: wbc 14.9; hgb 7.8; hct 22.5; mcv 101.4; plt 380; glucose 147; bun 29; creat 1.34; k+ 4.1; na ++ 135; ca 8.8 01-31-17: glucose 163; bun 38; creat 1.50 ;k+ 3.8; na++ 136; ca 8.8   NO NEW LABS   Review of Systems  Constitutional: Negative for malaise/fatigue.  Respiratory: Negative for cough and shortness of breath.   Cardiovascular: Negative for chest pain, palpitations and leg swelling.  Gastrointestinal: Negative for abdominal pain, constipation and heartburn.  Musculoskeletal: Positive for joint pain. Negative for back pain and myalgias.       Joint pain is improving.   Skin: Negative.   Neurological: Negative for dizziness.  Psychiatric/Behavioral: The patient is not nervous/anxious.    Physical Exam  Constitutional: He is oriented to person, place, and time. He appears well-developed and well-nourished. No distress.  Neck: No thyromegaly present.  Cardiovascular: Normal rate, regular rhythm, normal heart sounds and intact distal pulses.  Pulmonary/Chest: Effort normal and breath sounds normal. No respiratory distress.  Abdominal: Soft. Bowel sounds are normal. He exhibits no distension. There is no tenderness.  Musculoskeletal: He exhibits edema.    Is able to move all extremities Has less right hand swelling and right knee swelling.  Has 2-3+ lower extremity edema .   Lymphadenopathy:    He has no cervical adenopathy.  Neurological: He is alert and oriented to person, place, and time.  Skin: Skin is warm and dry. He is not diaphoretic.  Psychiatric: He has a normal mood and affect.    ASSESSMENT/ PLAN:  TODAY:   1. Gout attack 2. Lower extremity edema  Will change his lasix to 40 mg daily with k+ 20 meq daily  Will check bmp on 02-12-17  MD is aware of resident's narcotic use  and is in agreement with current plan of care. We will attempt to wean resident as apropriate   Ok Edwards NP [facility]  Contact [phone] Monday through Friday 8am- 5pm  After hours call [phone]

## 2017-02-06 LAB — CBC AND DIFFERENTIAL
HEMATOCRIT: 23 — AB (ref 41–53)
HEMOGLOBIN: 7.2 — AB (ref 13.5–17.5)
NEUTROS ABS: 12
Platelets: 549 — AB (ref 150–399)
WBC: 14.6

## 2017-02-06 LAB — HEPATIC FUNCTION PANEL
ALT: 39 (ref 10–40)
AST: 27 (ref 14–40)
Alkaline Phosphatase: 209 — AB (ref 25–125)
Bilirubin, Total: 0.2

## 2017-02-06 LAB — BASIC METABOLIC PANEL
BUN: 30 — AB (ref 4–21)
CREATININE: 1.2 (ref 0.6–1.3)
GLUCOSE: 113
POTASSIUM: 4.2 (ref 3.4–5.3)
Sodium: 140 (ref 137–147)

## 2017-02-07 ENCOUNTER — Non-Acute Institutional Stay (SKILLED_NURSING_FACILITY): Payer: 59 | Admitting: Adult Health

## 2017-02-07 ENCOUNTER — Encounter: Payer: Self-pay | Admitting: Adult Health

## 2017-02-07 DIAGNOSIS — M109 Gout, unspecified: Secondary | ICD-10-CM

## 2017-02-07 DIAGNOSIS — D649 Anemia, unspecified: Secondary | ICD-10-CM | POA: Diagnosis not present

## 2017-02-07 DIAGNOSIS — R748 Abnormal levels of other serum enzymes: Secondary | ICD-10-CM

## 2017-02-07 DIAGNOSIS — R600 Localized edema: Secondary | ICD-10-CM

## 2017-02-07 NOTE — Progress Notes (Signed)
Location:   [facility] Room Number: [place] of Service:  SNF (31)   CODE STATUS: Full Code  Allergies  Allergen Reactions  . Ace Inhibitors Swelling    Facial swelling    Chief Complaint  Patient presents with  . Acute Visit    Lab follow up    HPI:  He has had a drop in his hgb to 7.2 and has an elevated alk phos 209. He denies any chest pain; shortness of breath or increased fatigue. There are no reports of abnormal bleeding present. He does have lower extremity edema present.   Past Medical History:  Diagnosis Date  . Gout   . Hypertension     History reviewed. No pertinent surgical history.  Social History   Socioeconomic History  . Marital status: Divorced    Spouse name: Not on file  . Number of children: Not on file  . Years of education: Not on file  . Highest education level: Not on file  Social Needs  . Financial resource strain: Not on file  . Food insecurity - worry: Not on file  . Food insecurity - inability: Not on file  . Transportation needs - medical: Not on file  . Transportation needs - non-medical: Not on file  Occupational History  . Not on file  Tobacco Use  . Smoking status: Never Smoker  . Smokeless tobacco: Never Used  Substance and Sexual Activity  . Alcohol use: No    Frequency: Never  . Drug use: No  . Sexual activity: Not on file  Other Topics Concern  . Not on file  Social History Narrative  . Not on file   History reviewed. No pertinent family history.    VITAL SIGNS BP (!) 157/95   Pulse (!) 102   Temp 98.1 F (36.7 C)   Resp 18   Ht _0  (1.956 m)   Wt 248 lb 9.6 oz (112.8 kg)   SpO2 97%   BMI 29.48 kg/m    Outpatient Encounter Medications as of 02/07/2017  Medication Sig  . acetaminophen (TYLENOL) 500 MG tablet Take 500 mg by mouth every 6 (six) hours as needed for mild pain.  Marland Kitchen allopurinol (ZYLOPRIM) 100 MG tablet Take 200 mg by mouth daily.   Marland Kitchen amLODipine (NORVASC) 10 MG tablet Take 10  mg by mouth daily.  . cloNIDine (CATAPRES) 0.1 MG tablet Take 0.1 mg by mouth every 8 (eight) hours as needed. For systolic BP > or = 784  . colchicine 0.6 MG tablet Starting 02-04-17 - Give 1 tablet by mouth three times a day every 3 days 02/07/17 - 02/10/17 Give 1 tablet by mouth two times a day every 3 days until 02/10/17 02/11/17 Give 1 tablet by mouth one time a day routinely  . furosemide (LASIX) 40 MG tablet Take 40 mg by mouth daily as needed for edema.   . hydrALAZINE (APRESOLINE) 50 MG tablet Take 50 mg by mouth 3 (three) times daily.  . methocarbamol (ROBAXIN) 500 MG tablet Take 1 tablet (500 mg total) by mouth every 6 (six) hours as needed for muscle spasms.  . Multiple Vitamin (MULTIVITAMIN WITH MINERALS) TABS tablet Take 1 tablet by mouth daily.  Marland Kitchen omeprazole (PRILOSEC) 20 MG capsule Take 20 mg by mouth 2 (two) times daily before a meal.  . oxyCODONE (OXY IR/ROXICODONE) 5 MG immediate release tablet Take 1-2 tablets (5-10 mg total) by mouth every 4 (four) hours as needed for severe pain or  breakthrough pain.  . potassium chloride SA (K-DUR,KLOR-CON) 20 MEQ tablet Take 20 mEq by mouth daily.  . predniSONE (DELTASONE) 20 MG tablet Starting 02/02/17 - 02/07/17 Give 60 mg by mouth one time daily x 5 days 02/07/17 - 02/12/17 Give 40 mg by mouth one time daily x 5 days 02/12/17 - 02/17/17 Give 20 mg by mouth one time daily x 5 days   No facility-administered encounter medications on file as of 02/07/2017.      SIGNIFICANT DIAGNOSTIC EXAMS    PREVIOUS   01-24-17: chest x-ray: Lower lobe pneumonia visible on the lateral view, side indeterminate.   01-25-17: left knee x-ray: Large knee joint effusion. Previous tunneled ACL repair. Osteoarthritis, more advanced in the medial compartment and patellofemoral joint and lateral compartment.   01-25-17: right wrist x-ray: Indistinct hyperdensity dorsal to the carpal bones, which could be consistent with gout tophus given the history.  01-26-17: chest  x-ray: Suspect early pneumonia within the left lower lobe.   01-28-17: renal ultrasound: Normal renal ultrasound.    NO NEW EXAMS  LABS REVIEWED: PREVIOUS  01-24-17: wbc 17.4; hgb 8.6; hct 24.9; mcv 104.2; plt 284; glucose 167; bun 18; creat 1.06 ;k+ 2.4; na++ 137; ca 7.0; ast 49; total bili 2.0; albumin 3.4; blood culture: no growth; uric acid 8.7; sed rate 84; mag 0.7 01-26-17: wbc 16.6; hgb 7.5; hct 22.0; mcv 104.3; plt 234; glucose 187; bun 31; creat 1.59; k+ 4.8; na++ 136; ca 7.1; ast 44; total bili 1.3; albumin 2.5; mag 13; vit B 12: 358 01-29-17: wbc 14.9; hgb 7.8; hct 22.5; mcv 101.4; plt 380; glucose 147; bun 29; creat 1.34; k+ 4.1; na ++ 135; ca 8.8 01-31-17: glucose 163; bun 38; creat 1.50 ;k+ 3.8; na++ 136; ca 8.8   TODAY:   02-06-17: wbc 14.6; hg 7.2; hct 23.1; mcv 103.0 ;plt 549; glucose 113; bun 30; creat 1.18; k+ 4.2; na++ 140; ca 8.9; alk phos 209; albumin 3.0; mag 1.3; uric acid 8.3    Review of Systems  Constitutional: Negative for malaise/fatigue.  Respiratory: Negative for cough and shortness of breath.   Cardiovascular: Positive for leg swelling. Negative for chest pain and palpitations.  Gastrointestinal: Negative for abdominal pain, constipation and heartburn.  Musculoskeletal: Positive for joint pain. Negative for back pain and myalgias.  Skin: Negative.   Neurological: Negative for dizziness.  Psychiatric/Behavioral: The patient is not nervous/anxious.    Physical Exam  Constitutional: He is oriented to person, place, and time. He appears well-developed and well-nourished. No distress.  Neck: No thyromegaly present.  Cardiovascular: Normal rate, regular rhythm, normal heart sounds and intact distal pulses.  Pulmonary/Chest: Effort normal and breath sounds normal. No respiratory distress.  Abdominal: Soft. Bowel sounds are normal. He exhibits no distension. There is no tenderness.  Musculoskeletal: He exhibits edema.  Is able to move all extremities Has no  right hand swelling and right knee swelling.  Has 2-3+ lower extremity edema .    Lymphadenopathy:    He has no cervical adenopathy.  Neurological: He is alert and oriented to person, place, and time.  Skin: Skin is warm and dry. He is not diaphoretic.  Psychiatric: He has a normal mood and affect.     ASSESSMENT/ PLAN:  TODAY:   1. Bilateral lower extremity edema 2. Gout attack 3. Anemia 4. Elevated alk phos   Will get FOBT Will change lasix to 40 mg twice daily  On 02-13-17: will check cbc; cmp   MD is aware of resident's  narcotic use and is in agreement with current plan of care. We will attempt to wean resident as apropriate   Ok Edwards NP [facility]  Contact [phone] Monday through Friday 8am- 5pm  After hours call [phone]

## 2017-02-08 ENCOUNTER — Other Ambulatory Visit: Payer: Self-pay

## 2017-02-08 ENCOUNTER — Non-Acute Institutional Stay (SKILLED_NURSING_FACILITY): Payer: 59 | Admitting: Internal Medicine

## 2017-02-08 ENCOUNTER — Encounter: Payer: Self-pay | Admitting: Internal Medicine

## 2017-02-08 DIAGNOSIS — J189 Pneumonia, unspecified organism: Secondary | ICD-10-CM

## 2017-02-08 DIAGNOSIS — J181 Lobar pneumonia, unspecified organism: Secondary | ICD-10-CM

## 2017-02-08 DIAGNOSIS — D649 Anemia, unspecified: Secondary | ICD-10-CM | POA: Diagnosis not present

## 2017-02-08 DIAGNOSIS — M109 Gout, unspecified: Secondary | ICD-10-CM

## 2017-02-08 DIAGNOSIS — I10 Essential (primary) hypertension: Secondary | ICD-10-CM

## 2017-02-08 DIAGNOSIS — K2970 Gastritis, unspecified, without bleeding: Secondary | ICD-10-CM

## 2017-02-08 DIAGNOSIS — R600 Localized edema: Secondary | ICD-10-CM

## 2017-02-08 MED ORDER — OXYCODONE HCL 10 MG PO TABS: 10.0000 mg | ORAL_TABLET | ORAL | 0 refills | Status: DC

## 2017-02-08 NOTE — Telephone Encounter (Signed)
RX faxed to [facility] @ [phone], phone number [phone] 

## 2017-02-08 NOTE — Progress Notes (Signed)
Patient ID: Scott [name], male   DOB: 27-Apr-1965, 52 y.o.   MRN: [id number]   Provider:  DR Elmon Kirschner Location:  [facility] Room Number: 120 A Place of Service:  SNF (31)  PCP: Patient, No Pcp Per Patient Care Team: Patient, No Pcp Per as PCP - General (General Practice)  Extended Emergency Contact Information Primary Emergency Contact: king,leslie Mobile Phone: [phone] Relation: Friend  Code Status: Full Code Goals of Care: Advanced Directive information Advanced Directives 02/08/2017  Does Patient Have a Medical Advance Directive? No  Would patient like information on creating a medical advance directive? No - Patient declined      Chief Complaint  Patient presents with  . New Admit To SNF    Admission    HPI: Patient is a 52 y.o. male seen today for admission to SNF following hospital stay for gout attack, electrolyte disturbance, LLL CAP, recent GI bleed s/p PRBCs. He underwent arthrocentesis by Ortho. UE doppler US neg for DVT. CXR revealed LLL pneumonia with neg Strep pneumo and Legionella antigens. He completed azithromycin course and was d/c'd on 2 additional days of cefpodoxime. Albumin 2.5; Cr peaked 1.72-->1.5; AST 44; Tbili 1.3; Na dropped 133 -->136; Hgb dropped 7.2-->7.8 at d/c. He presents to SNF for short term rehab.  Today he reports c/a worsening swelling in b/l LE since beginning prednisone tx. Pain better controlled on ATC oxycodone. He would like to be d/c'd home as he does not feel that he is getting any better. No CP, SOB, palpitations. No f/c. He has L>R knee swelling. No falls. Weight trending up since admission. Appetite ok and sleeps well.  Hypertension - stable on norvasc 10 mg daily; apresoline 50 mg three times; clonidine 0.1 mg every 8 hours as needed for systolic >=180.  Gastritis with recent GI bleed - stable on prilosec 20 mg daily   Gout - recent attack of right hand/wrist/elbow/left knee: improving on  allopurinol 200 mg daily; prednisone taper; oxycodone 5 or 10 mg every 4 hours as needed for pain  Hx Anemia - d/c Hgb b 7.8   Bilateral lower extremity edema - stable on lasix 20 mg daily for weight gain for 3 pounds or more.    Past Medical History:  Diagnosis Date  . Gout   . Hypertension    Past Surgical History:  Procedure Laterality Date  . APPENDECTOMY     open  . KNEE ARTHROSCOPY Left    partial revision    reports that  has never smoked. His smokeless tobacco use includes chew. He reports that he does not drink alcohol or use drugs. Social History   Socioeconomic History  . Marital status: Divorced    Spouse name: Not on file  . Number of children: Not on file  . Years of education: Not on file  . Highest education level: Not on file  Social Needs  . Financial resource strain: Not on file  . Food insecurity - worry: Not on file  . Food insecurity - inability: Not on file  . Transportation needs - medical: Not on file  . Transportation needs - non-medical: Not on file  Occupational History  . Not on file  Tobacco Use  . Smoking status: Never Smoker  . Smokeless tobacco: Current User    Types: Chew  Substance and Sexual Activity  . Alcohol use: No    Frequency: Never  . Drug use: No  . Sexual activity: Not on file  Other  Topics Concern  . Not on file  Social History Narrative  . Not on file    Functional Status Survey:    Family History  Problem Relation Age of Onset  . Cancer Paternal Grandmother        ovarian    Health Maintenance  Topic Date Due  . TETANUS/TDAP  02/01/2018 (Originally 03/04/1986)  . HIV Screening  02/01/2018 (Originally 03/04/1982)  . INFLUENZA VACCINE  Completed    Allergies  Allergen Reactions  . Ace Inhibitors Swelling    Facial swelling    Outpatient Encounter Medications as of 02/08/2017  Medication Sig  . acetaminophen (TYLENOL) 500 MG tablet Take 500 mg by mouth every 6 (six) hours as needed for mild pain.  Marland Kitchen  allopurinol (ZYLOPRIM) 100 MG tablet Take 200 mg by mouth daily.   Marland Kitchen amLODipine (NORVASC) 10 MG tablet Take 10 mg by mouth daily.  . cloNIDine (CATAPRES) 0.1 MG tablet Take 0.1 mg by mouth every 8 (eight) hours as needed. For systolic BP > or = 180  . colchicine 0.6 MG tablet Starting 02-04-17 - Give 1 tablet by mouth three times a day every 3 days 02/07/17 - 02/10/17 Give 1 tablet by mouth two times a day every 3 days until 02/10/17 02/11/17 Give 1 tablet by mouth one time a day routinely  . furosemide (LASIX) 40 MG tablet Take 40 mg by mouth 2 (two) times daily.   . hydrALAZINE (APRESOLINE) 50 MG tablet Take 50 mg by mouth 3 (three) times daily.  . methocarbamol (ROBAXIN) 500 MG tablet Take 1 tablet (500 mg total) by mouth every 6 (six) hours as needed for muscle spasms.  . Multiple Vitamin (MULTIVITAMIN WITH MINERALS) TABS tablet Take 1 tablet by mouth daily.  Marland Kitchen omeprazole (PRILOSEC) 20 MG capsule Take 20 mg by mouth 2 (two) times daily before a meal.  . potassium chloride SA (K-DUR,KLOR-CON) 20 MEQ tablet Take 20 mEq by mouth daily.  . predniSONE (DELTASONE) 20 MG tablet Starting 02/02/17 - 02/07/17 Give 60 mg by mouth one time daily x 5 days 02/07/17 - 02/12/17 Give 40 mg by mouth one time daily x 5 days 02/12/17 - 02/17/17 Give 20 mg by mouth one time daily x 5 days  . [DISCONTINUED] oxyCODONE (OXY IR/ROXICODONE) 5 MG immediate release tablet Take 1-2 tablets (5-10 mg total) by mouth every 4 (four) hours as needed for severe pain or breakthrough pain. (Patient not taking: Reported on 02/08/2017)   No facility-administered encounter medications on file as of 02/08/2017.     Review of Systems  Constitutional: Positive for unexpected weight change.  Cardiovascular: Positive for leg swelling.  Musculoskeletal: Positive for arthralgias, back pain, gait problem and joint swelling.  All other systems reviewed and are negative.   Vitals:   02/08/17 1144  BP: 120/65  Pulse: 92  Resp: 18  Temp: 98.1 F  (36.7 C)  SpO2: 97%  Weight: 250 lb (113.4 kg)  Height: 6\' 5"  (1.956 m)   Body mass index is 29.65 kg/m. Physical Exam  Constitutional: He is oriented to person, place, and time. He appears well-developed and well-nourished.  HENT:  Mouth/Throat: Oropharynx is clear and moist.  MMM; no oral thrush  Eyes: Pupils are equal, round, and reactive to light. No scleral icterus.  Neck: Neck supple. Carotid bruit is not present. No thyromegaly present.  Cardiovascular: Regular rhythm and intact distal pulses. Tachycardia present. Exam reveals no gallop and no friction rub.  Murmur (1/6 SEM) heard. No pitting LE  edema b/l. No calf TTP  Pulmonary/Chest: Effort normal and breath sounds normal. He has no wheezes. He has no rales. He exhibits no tenderness.  Abdominal: Soft. Normal appearance and bowel sounds are normal. He exhibits no distension, no abdominal bruit, no pulsatile midline mass and no mass. There is no hepatomegaly. There is no tenderness. There is no rigidity, no rebound and no guarding. No hernia.  obese  Musculoskeletal: He exhibits edema (L>R knee with reduced ROM).  Lymphadenopathy:    He has no cervical adenopathy.  Neurological: He is alert and oriented to person, place, and time.  Skin: Skin is warm and dry. No rash noted.  Psychiatric: Judgment and thought content normal. His affect is angry. He is agitated.    Labs reviewed: Basic Metabolic Panel: Recent Labs    01/24/17 1425  01/26/17 0359 01/27/17 0426 01/28/17 0343 01/29/17 0439 01/31/17 0404 02/06/17  NA 138   < > 136 139 140 135 136 140  K 3.0*   < > 4.8 4.2 4.0 4.1 3.8 4.2  CL 92*   < > 100* 105 107 103 102  --   CO2 31   < > 25 25 25 23 26   --   GLUCOSE 153*   < > 187* 159* 150* 147* 163*  --   BUN 16   < > 31* 40* 34* 29* 38* 30*  CREATININE 0.92   < > 1.59* 1.72* 1.49* 1.34* 1.50* 1.2  CALCIUM 6.7*   < > 7.1* 8.0* 8.6* 8.8* 8.8*  --   MG 0.8*  --  1.3* 1.8  --   --   --   --    < > = values in this  interval not displayed.   Liver Function Tests: Recent Labs    01/24/17 0855 01/26/17 0359 02/06/17  AST 49* 44* 27  ALT 26 22 39  ALKPHOS 101 92 209*  BILITOT 2.0* 1.3*  --   PROT 7.4 6.5  --   ALBUMIN 3.4* 2.5*  --    No results for input(s): LIPASE, AMYLASE in the last 8760 hours. No results for input(s): AMMONIA in the last 8760 hours. CBC: Recent Labs    01/24/17 0855  01/26/17 0359 01/27/17 0426 01/28/17 0343 01/29/17 0439 02/06/17  WBC 17.4*   < > 16.6* 17.1* 14.0* 14.9* 14.6  NEUTROABS 14.1*  --  13.9*  --   --   --  12  HGB 8.6*   < > 7.5* 7.2* 7.7* 7.8* 7.2*  HCT 24.9*   < > 22.0*  23.1* 21.0* 23.4* 22.5* 23*  MCV 104.2*   < > 104.3* 102.9* 103.5* 101.4*  --   PLT 284   < > 234 287 348 380 549*   < > = values in this interval not displayed.   Cardiac Enzymes: No results for input(s): CKTOTAL, CKMB, CKMBINDEX, TROPONINI in the last 8760 hours. BNP: Invalid input(s): POCBNP No results found for: HGBA1C Lab Results  Component Value Date   TSH 1.039 01/26/2017   Lab Results  Component Value Date   VITAMINB12 358 01/26/2017   No results found for: FOLATE No results found for: IRON, TIBC, FERRITIN  Imaging and Procedures obtained prior to SNF admission: Dg Chest 2 View  Result Date: 01/24/2017 CLINICAL DATA:  High fever. EXAM: CHEST  2 VIEW COMPARISON:  None. FINDINGS: Heart size is normal. Mediastinal shadows are normal. Abnormal density on the lateral view overlying the lower spine is consistent with lower lobe pneumonia.  Side indeterminate based on the frontal view. No effusions. No acute bone finding. IMPRESSION: Lower lobe pneumonia visible on the lateral view, side indeterminate. Electronically Signed   By: Paulina FusiMark  Shogry M.D.   On: 01/24/2017 15:54   Dg Wrist 2 Views Right  Result Date: 01/25/2017 CLINICAL DATA:  Radial side swelling.  History of gout. EXAM: RIGHT WRIST - 2 VIEW COMPARISON:  None. FINDINGS: No evidence of fracture. No visible bone  erosion. 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 IMPRESSION: Indistinct hyperdensity dorsal to the carpal bones, which could be consistent with gout tophus given the history. Electronically Signed   By: Paulina FusiMark  Shogry M.D.   On: 01/25/2017 10:20   Dg Knee Complete 4 Views Left  Result Date: 01/25/2017 CLINICAL DATA:  Pain and swelling of the left knee. EXAM: LEFT KNEE - COMPLETE 4+ VIEW COMPARISON:  None. FINDINGS: Large knee joint effusion. Previous tunneled ACL repair. Hardware appears unremarkable. Degenerative arthritis with joint space narrowing and osteophytes, worse in the medial compartment. Patellofemoral osteoarthritis as well. IMPRESSION: Large knee joint effusion. Previous tunneled ACL repair. Osteoarthritis, more advanced in the medial compartment and patellofemoral joint and lateral compartment. Electronically Signed   By: Paulina FusiMark  Shogry M.D.   On: 01/25/2017 10:19    Assessment/Plan   ICD-10-CM   1. Acute gout of multiple sites, unspecified cause M10.9    improving  2. Essential hypertension I10   3. Gastritis, presence of bleeding unspecified, unspecified chronicity, unspecified gastritis type K29.70   4. Anemia, unspecified type D64.9   5. Pneumonia of left lower lobe due to infectious organism (HCC) J18.1   6. Bilateral lower extremity edema R60.0    Cont current meds as ordered  PT/OT as ordered  F/u with specialists as scheduled  Family/ staff Communication: recommend rheumatology referral  Labs/tests ordered: needs f/u CXR as o/p   Camdon Saetern S. Ancil Linseyarter, D. O., F. A. C. O. I.  [facility] [address] [place], [place] [address] [phone] Cell (Monday-Friday 8 AM - 5 PM) [phone] After 5 PM and follow prompts

## 2017-02-09 ENCOUNTER — Other Ambulatory Visit: Payer: Self-pay

## 2017-02-09 ENCOUNTER — Encounter: Payer: Self-pay | Admitting: Adult Health

## 2017-02-09 ENCOUNTER — Non-Acute Institutional Stay (SKILLED_NURSING_FACILITY): Payer: 59 | Admitting: Adult Health

## 2017-02-09 DIAGNOSIS — R600 Localized edema: Secondary | ICD-10-CM | POA: Diagnosis not present

## 2017-02-09 DIAGNOSIS — I10 Essential (primary) hypertension: Secondary | ICD-10-CM | POA: Diagnosis not present

## 2017-02-09 DIAGNOSIS — M109 Gout, unspecified: Secondary | ICD-10-CM

## 2017-02-09 MED ORDER — OXYCODONE HCL 10 MG PO TABS: 10.0000 mg | ORAL_TABLET | ORAL | 0 refills | Status: DC | PRN

## 2017-02-09 NOTE — Progress Notes (Signed)
Location:   [facility] Room Number: 120 A Place of Service:  SNF (31)    CODE STATUS: Full Code  Allergies  Allergen Reactions  . Ace Inhibitors Swelling    Facial swelling    Chief Complaint  Patient presents with  . Discharge Note    Discharging to home 02/09/17    HPI:  He is being discharged to home with home health for pt/ot/rn. He will not need dme. He will need his prescriptions to be written. He will need to follow up with mis medical provider.  He had been hospitalized for acute gouty attack and pneumonia. He was admitted to this facility for short term rehab. He is now ready for discharge to home.    Past Medical History:  Diagnosis Date  . Gout   . Hypertension     Past Surgical History:  Procedure Laterality Date  . APPENDECTOMY     open  . KNEE ARTHROSCOPY Left    partial revision    Social History   Socioeconomic History  . Marital status: Divorced    Spouse name: Not on file  . Number of children: Not on file  . Years of education: Not on file  . Highest education level: Not on file  Social Needs  . Financial resource strain: Not on file  . Food insecurity - worry: Not on file  . Food insecurity - inability: Not on file  . Transportation needs - medical: Not on file  . Transportation needs - non-medical: Not on file  Occupational History  . Not on file  Tobacco Use  . Smoking status: Never Smoker  . Smokeless tobacco: Current User    Types: Chew  Substance and Sexual Activity  . Alcohol use: No    Frequency: Never  . Drug use: No  . Sexual activity: Not on file  Other Topics Concern  . Not on file  Social History Narrative  . Not on file   Family History  Problem Relation Age of Onset  . Cancer Paternal Grandmother        ovarian    VITAL SIGNS BP 124/73   Pulse 85   Temp 97.8 F (36.6 C)   Resp 16   Ht _0  (1.956 m)   Wt 247 lb 9.6 oz (112.3 kg)   SpO2 97%   BMI 29.36 kg/m    Patient's Medications    New Prescriptions   No medications on file  Previous Medications   ACETAMINOPHEN (TYLENOL) 500 MG TABLET    Take 500 mg by mouth every 6 (six) hours as needed for mild pain.   ALLOPURINOL (ZYLOPRIM) 100 MG TABLET    Take 200 mg by mouth daily.    AMLODIPINE (NORVASC) 10 MG TABLET    Take 10 mg by mouth daily.   CLONIDINE (CATAPRES) 0.1 MG TABLET    Take 0.1 mg by mouth every 8 (eight) hours as needed. For systolic BP > or = 449   COLCHICINE 0.6 MG TABLET    Starting 02-04-17 - Give 1 tablet by mouth three times a day every 3 days 02/07/17 - 02/10/17 Give 1 tablet by mouth two times a day every 3 days until 02/10/17 02/11/17 Give 1 tablet by mouth one time a day routinely   FUROSEMIDE (LASIX) 40 MG TABLET    Take 40 mg by mouth 2 (two) times daily.    HYDRALAZINE (APRESOLINE) 50 MG TABLET    Take 50 mg by mouth 3 (three)  times daily.   METHOCARBAMOL (ROBAXIN) 500 MG TABLET    Take 1 tablet (500 mg total) by mouth every 6 (six) hours as needed for muscle spasms.   MULTIPLE VITAMIN (MULTIVITAMIN WITH MINERALS) TABS TABLET    Take 1 tablet by mouth daily.   OMEPRAZOLE (PRILOSEC) 20 MG CAPSULE    Take 20 mg by mouth 2 (two) times daily before a meal.   OXYCODONE HCL 10 MG TABS    Take 1 tablet (10 mg total) by mouth every 4 (four) hours.   POTASSIUM CHLORIDE SA (K-DUR,KLOR-CON) 20 MEQ TABLET    Take 20 mEq by mouth daily.   PREDNISONE (DELTASONE) 20 MG TABLET    Starting 02/02/17 - 02/07/17 Give 60 mg by mouth one time daily x 5 days 02/07/17 - 02/12/17 Give 40 mg by mouth one time daily x 5 days 02/12/17 - 02/17/17 Give 20 mg by mouth one time daily x 5 days  Modified Medications   No medications on file  Discontinued Medications   No medications on file     SIGNIFICANT DIAGNOSTIC EXAMS  PREVIOUS   01-24-17: chest x-ray: Lower lobe pneumonia visible on the lateral view, side indeterminate.   01-25-17: left knee x-ray: Large knee joint effusion. Previous tunneled ACL repair. Osteoarthritis, more  advanced in the medial compartment and patellofemoral joint and lateral compartment.   01-25-17: right wrist x-ray: Indistinct hyperdensity dorsal to the carpal bones, which could be consistent with gout tophus given the history.  01-26-17: chest x-ray: Suspect early pneumonia within the left lower lobe.   01-28-17: renal ultrasound: Normal renal ultrasound.    NO NEW EXAMS  LABS REVIEWED: PREVIOUS  01-24-17: wbc 17.4; hgb 8.6; hct 24.9; mcv 104.2; plt 284; glucose 167; bun 18; creat 1.06 ;k+ 2.4; na++ 137; ca 7.0; ast 49; total bili 2.0; albumin 3.4; blood culture: no growth; uric acid 8.7; sed rate 84; mag 0.7 01-26-17: wbc 16.6; hgb 7.5; hct 22.0; mcv 104.3; plt 234; glucose 187; bun 31; creat 1.59; k+ 4.8; na++ 136; ca 7.1; ast 44; total bili 1.3; albumin 2.5; mag 13; vit B 12: 358 01-29-17: wbc 14.9; hgb 7.8; hct 22.5; mcv 101.4; plt 380; glucose 147; bun 29; creat 1.34; k+ 4.1; na ++ 135; ca 8.8 01-31-17: glucose 163; bun 38; creat 1.50 ;k+ 3.8; na++ 136; ca 8.8  02-06-17: wbc 14.6; hg 7.2; hct 23.1; mcv 103.0 ;plt 549; glucose 113; bun 30; creat 1.18; k+ 4.2; na++ 140; ca 8.9; alk phos 209; albumin 3.0; mag 1.3; uric acid 8.3    NO NEW LABS    Review of Systems  Constitutional: Negative for malaise/fatigue.  Respiratory: Negative for cough and shortness of breath.   Cardiovascular: Positive for leg swelling. Negative for chest pain and palpitations.  Gastrointestinal: Negative for abdominal pain, constipation and heartburn.  Musculoskeletal: Negative for back pain, joint pain and myalgias.  Skin: Negative.   Neurological: Negative for dizziness.  Psychiatric/Behavioral: The patient is not nervous/anxious.    Physical Exam  Constitutional: He is oriented to person, place, and time. He appears well-developed and well-nourished. No distress.  Neck: No thyromegaly present.  Cardiovascular: Normal rate, regular rhythm, normal heart sounds and intact distal pulses.    Pulmonary/Chest: Effort normal and breath sounds normal. No respiratory distress.  Abdominal: Soft. Bowel sounds are normal. He exhibits no distension. There is no tenderness.  Musculoskeletal: Normal range of motion. He exhibits no edema.  Lymphadenopathy:    He has no cervical adenopathy.  Neurological: He  is alert and oriented to person, place, and time.  Skin: Skin is warm and dry. He is not diaphoretic.  Psychiatric: He has a normal mood and affect.    ASSESSMENT/ PLAN:  Patient is being discharged with the following home health services:  Pt/ot/rn to evaluate and treat as indicated for gait balance strength adl training and medication management   Patient is being discharged with the following durable medical equipment:  None needed  Patient has been advised to f/u with their PCP in 1-2 weeks to bring them up to date on their rehab stay.  Social services at facility was responsible for arranging this appointment.  Pt was provided with a 30 day supply of prescriptions for medications and refills must be obtained from their PCP.  For controlled substances, a more limited supply may be provided adequate until PCP appointment only.   A 30 day supply of his medications have been written per the medication list above Narcotic written as below:  #20 oxycodone 10 mg tabs   Time spent with patient: 35 minutes: discussed needs for regarding DME none needed; medication regimen and home health needs; verbalized understanding.   Ok Edwards NP [facility]  Contact [phone] Monday through Friday 8am- 5pm  After hours call [phone]

## 2017-02-09 NOTE — Telephone Encounter (Signed)
RX faxed to [facility] @ [phone], phone number [phone] 

## 2017-02-17 DIAGNOSIS — R600 Localized edema: Secondary | ICD-10-CM | POA: Insufficient documentation

## 2017-02-21 DIAGNOSIS — R748 Abnormal levels of other serum enzymes: Secondary | ICD-10-CM | POA: Insufficient documentation

## 2017-03-20 ENCOUNTER — Emergency Department (HOSPITAL_COMMUNITY): Payer: 59

## 2017-03-20 ENCOUNTER — Inpatient Hospital Stay (HOSPITAL_COMMUNITY)
Admission: EM | Admit: 2017-03-20 | Discharge: 2017-03-23 | DRG: 554 | Disposition: A | Discharge status: Home / Self Care | Payer: 59 | Attending: Family Medicine | Admitting: Family Medicine

## 2017-03-20 ENCOUNTER — Encounter (HOSPITAL_COMMUNITY): Payer: Self-pay

## 2017-03-20 DIAGNOSIS — R600 Localized edema: Secondary | ICD-10-CM | POA: Diagnosis not present

## 2017-03-20 DIAGNOSIS — R509 Fever, unspecified: Secondary | ICD-10-CM | POA: Diagnosis present

## 2017-03-20 DIAGNOSIS — K219 Gastro-esophageal reflux disease without esophagitis: Secondary | ICD-10-CM | POA: Diagnosis present

## 2017-03-20 DIAGNOSIS — R011 Cardiac murmur, unspecified: Secondary | ICD-10-CM | POA: Diagnosis present

## 2017-03-20 DIAGNOSIS — M11251 Other chondrocalcinosis, right hip: Secondary | ICD-10-CM | POA: Diagnosis present

## 2017-03-20 DIAGNOSIS — D649 Anemia, unspecified: Secondary | ICD-10-CM | POA: Diagnosis present

## 2017-03-20 DIAGNOSIS — I10 Essential (primary) hypertension: Secondary | ICD-10-CM | POA: Diagnosis present

## 2017-03-20 DIAGNOSIS — M1712 Unilateral primary osteoarthritis, left knee: Secondary | ICD-10-CM | POA: Diagnosis present

## 2017-03-20 DIAGNOSIS — M1000 Idiopathic gout, unspecified site: Secondary | ICD-10-CM | POA: Diagnosis not present

## 2017-03-20 DIAGNOSIS — D500 Iron deficiency anemia secondary to blood loss (chronic): Secondary | ICD-10-CM | POA: Diagnosis present

## 2017-03-20 DIAGNOSIS — M109 Gout, unspecified: Secondary | ICD-10-CM | POA: Diagnosis not present

## 2017-03-20 DIAGNOSIS — M25561 Pain in right knee: Secondary | ICD-10-CM | POA: Diagnosis not present

## 2017-03-20 DIAGNOSIS — M25562 Pain in left knee: Secondary | ICD-10-CM

## 2017-03-20 DIAGNOSIS — Z8041 Family history of malignant neoplasm of ovary: Secondary | ICD-10-CM

## 2017-03-20 DIAGNOSIS — M2550 Pain in unspecified joint: Secondary | ICD-10-CM | POA: Diagnosis present

## 2017-03-20 DIAGNOSIS — R6510 Systemic inflammatory response syndrome (SIRS) of non-infectious origin without acute organ dysfunction: Secondary | ICD-10-CM | POA: Diagnosis present

## 2017-03-20 DIAGNOSIS — Z888 Allergy status to other drugs, medicaments and biological substances status: Secondary | ICD-10-CM | POA: Diagnosis not present

## 2017-03-20 DIAGNOSIS — F17290 Nicotine dependence, other tobacco product, uncomplicated: Secondary | ICD-10-CM | POA: Diagnosis present

## 2017-03-20 LAB — CBC WITH DIFFERENTIAL/PLATELET
BASOS ABS: 0 10*3/uL (ref 0.0–0.1)
Basophils Relative: 0 %
EOS ABS: 0 10*3/uL (ref 0.0–0.7)
Eosinophils Relative: 0 %
HCT: 27.2 % — ABNORMAL LOW (ref 39.0–52.0)
Hemoglobin: 8.6 g/dL — ABNORMAL LOW (ref 13.0–17.0)
Lymphocytes Relative: 11 %
Lymphs Abs: 1.7 10*3/uL (ref 0.7–4.0)
MCH: 26.7 pg (ref 26.0–34.0)
MCHC: 31.6 g/dL (ref 30.0–36.0)
MCV: 84.5 fL (ref 78.0–100.0)
MONO ABS: 0.9 10*3/uL (ref 0.1–1.0)
Monocytes Relative: 6 %
Neutro Abs: 12.7 10*3/uL — ABNORMAL HIGH (ref 1.7–7.7)
Neutrophils Relative %: 83 %
Platelets: 314 10*3/uL (ref 150–400)
RBC: 3.22 MIL/uL — AB (ref 4.22–5.81)
RDW: 22 % — ABNORMAL HIGH (ref 11.5–15.5)
WBC: 15.3 10*3/uL — AB (ref 4.0–10.5)

## 2017-03-20 LAB — COMPREHENSIVE METABOLIC PANEL
ALBUMIN: 3.3 g/dL — AB (ref 3.5–5.0)
ALK PHOS: 92 U/L (ref 38–126)
ALT: 14 U/L — AB (ref 17–63)
ANION GAP: 15 (ref 5–15)
AST: 14 U/L — ABNORMAL LOW (ref 15–41)
BUN: 10 mg/dL (ref 6–20)
CO2: 24 mmol/L (ref 22–32)
Calcium: 9.7 mg/dL (ref 8.9–10.3)
Chloride: 101 mmol/L (ref 101–111)
Creatinine, Ser: 1 mg/dL (ref 0.61–1.24)
GFR calc Af Amer: >60 mL/min (ref >60)
GFR calc non Af Amer: >60 mL/min (ref >60)
GLUCOSE: 128 mg/dL — AB (ref 65–99)
Potassium: 3.7 mmol/L (ref 3.5–5.1)
SODIUM: 140 mmol/L (ref 135–145)
Total Bilirubin: 0.8 mg/dL (ref 0.3–1.2)
Total Protein: 7.2 g/dL (ref 6.5–8.1)

## 2017-03-20 LAB — I-STAT CG4 LACTIC ACID, ED: Lactic Acid, Venous: 0.91 mmol/L (ref 0.5–1.9)

## 2017-03-20 LAB — SEDIMENTATION RATE: Sed Rate: 105 mm/hr — ABNORMAL HIGH (ref 0–16)

## 2017-03-20 LAB — C-REACTIVE PROTEIN: CRP: 20.4 mg/dL — ABNORMAL HIGH (ref <1.0)

## 2017-03-20 MED ORDER — GABAPENTIN 600 MG PO TABS
600.0000 mg | ORAL_TABLET | Freq: Three times a day (TID) | ORAL | Status: DC
  Administered 2017-03-21 (×4): 600 mg via ORAL
  Filled 2017-03-20 (×4): qty 1

## 2017-03-20 MED ORDER — ACETAMINOPHEN 500 MG PO TABS
1000.0000 mg | ORAL_TABLET | Freq: Once | ORAL | Status: AC
  Administered 2017-03-20: 1000 mg via ORAL
  Filled 2017-03-20: qty 2

## 2017-03-20 MED ORDER — POLYETHYLENE GLYCOL 3350 17 G PO PACK: 17.0000 g | PACK | Freq: Every day | ORAL | Status: DC | PRN

## 2017-03-20 MED ORDER — HYDRALAZINE HCL 50 MG PO TABS
50.0000 mg | ORAL_TABLET | Freq: Three times a day (TID) | ORAL | Status: DC
  Administered 2017-03-21 – 2017-03-23 (×8): 50 mg via ORAL
  Filled 2017-03-20 (×8): qty 1

## 2017-03-20 MED ORDER — ALLOPURINOL 100 MG PO TABS
100.0000 mg | ORAL_TABLET | Freq: Every day | ORAL | Status: DC
  Administered 2017-03-21 – 2017-03-23 (×3): 100 mg via ORAL
  Filled 2017-03-20 (×3): qty 1

## 2017-03-20 MED ORDER — METHOCARBAMOL 500 MG PO TABS
500.0000 mg | ORAL_TABLET | Freq: Four times a day (QID) | ORAL | Status: DC | PRN
  Administered 2017-03-21 – 2017-03-22 (×3): 500 mg via ORAL
  Filled 2017-03-20 (×3): qty 1

## 2017-03-20 MED ORDER — METOPROLOL SUCCINATE ER 50 MG PO TB24
50.0000 mg | ORAL_TABLET | Freq: Every day | ORAL | Status: DC
  Administered 2017-03-21 – 2017-03-23 (×3): 50 mg via ORAL
  Filled 2017-03-20 (×3): qty 1

## 2017-03-20 MED ORDER — ACETAMINOPHEN 325 MG PO TABS
650.0000 mg | ORAL_TABLET | Freq: Four times a day (QID) | ORAL | Status: DC | PRN
  Administered 2017-03-23: 650 mg via ORAL
  Filled 2017-03-20: qty 2

## 2017-03-20 MED ORDER — SODIUM CHLORIDE 0.9 % IV BOLUS (SEPSIS)
1000.0000 mL | Freq: Once | INTRAVENOUS | Status: AC
  Administered 2017-03-20: 1000 mL via INTRAVENOUS

## 2017-03-20 MED ORDER — PANTOPRAZOLE SODIUM 40 MG PO TBEC
40.0000 mg | DELAYED_RELEASE_TABLET | Freq: Every day | ORAL | Status: DC
  Administered 2017-03-21 – 2017-03-23 (×3): 40 mg via ORAL
  Filled 2017-03-20 (×3): qty 1

## 2017-03-20 MED ORDER — ONDANSETRON HCL 4 MG PO TABS: 4.0000 mg | ORAL_TABLET | Freq: Four times a day (QID) | ORAL | Status: DC | PRN

## 2017-03-20 MED ORDER — ONDANSETRON HCL 4 MG/2ML IJ SOLN: 4.0000 mg | Freq: Four times a day (QID) | INTRAMUSCULAR | Status: DC | PRN

## 2017-03-20 MED ORDER — OXYCODONE-ACETAMINOPHEN 5-325 MG PO TABS
1.0000 | ORAL_TABLET | ORAL | Status: DC | PRN
  Administered 2017-03-21 – 2017-03-22 (×9): 1 via ORAL
  Filled 2017-03-20 (×9): qty 1

## 2017-03-20 MED ORDER — ENOXAPARIN SODIUM 40 MG/0.4ML ~~LOC~~ SOLN
40.0000 mg | SUBCUTANEOUS | Status: DC
  Administered 2017-03-21 – 2017-03-23 (×3): 40 mg via SUBCUTANEOUS
  Filled 2017-03-20 (×3): qty 0.4

## 2017-03-20 MED ORDER — ZOLPIDEM TARTRATE 5 MG PO TABS: 5.0000 mg | ORAL_TABLET | Freq: Every evening | ORAL | Status: DC | PRN

## 2017-03-20 MED ORDER — HYDRALAZINE HCL 20 MG/ML IJ SOLN: 5.0000 mg | INTRAMUSCULAR | Status: DC | PRN

## 2017-03-20 MED ORDER — COLCHICINE 0.6 MG PO TABS
0.6000 mg | ORAL_TABLET | Freq: Two times a day (BID) | ORAL | Status: DC
  Administered 2017-03-21 – 2017-03-23 (×6): 0.6 mg via ORAL
  Filled 2017-03-20 (×6): qty 1

## 2017-03-20 MED ORDER — CLONIDINE HCL 0.1 MG PO TABS
0.1000 mg | ORAL_TABLET | Freq: Three times a day (TID) | ORAL | Status: DC
  Administered 2017-03-21 – 2017-03-23 (×7): 0.1 mg via ORAL
  Filled 2017-03-20 (×7): qty 1

## 2017-03-20 NOTE — H&P (Signed)
History and Physical    Naveen Clardy UQJ:[id number] DOB: Nov 18, 1965 DOA: 03/20/2017  Referring MD/NP/PA:   PCP: Patient, No Pcp Per   Patient coming from:  The patient is coming from home.  At baseline, pt is independent for most of ADL.  Chief Complaint: joint pain and swelling in bilateral knees and right wrist  HPI: Elishua Radford is a 52 y.o. male with medical history significant of gout, hypertension, GI bleeding, GERD, who presents with joint pain and swelling in bilateral knees and right wrist.   Pt state that he has gout flare up in the past several days. He has swelling and severe pain in bilateral knees (the left is worse than the right) and right wrist. He also has fever and chill. He states that these symptoms are typical for his gout flareup. Currently the pain is constant, 9 out of 10 in severity. Patient denies chest pain, shortness breath, cough, no nausea, vomiting, diarrhea or abdominal pain. No symptoms of UTI. Patient has chronic bilateral leg edema.  ED Course: pt was found to have WBC 15.3, lactic acid is 0.91, BNP 81.2, electrolytes renal function okay, temperature 101.0, tachycardia, oxygen saturation 100% on room air, negative chest x-ray.  # X-ray of right hand showed subtle erosion of carpal bones consistent with gout given history. # X-ray of Left knee showed osteoarthritic changes of the left knee. # Right knee x-ray showed chondrocalcinosis of the femoral tibial joint spaces.  Review of Systems:   General: has fevers, chills, no body weight gain, has fatigue HEENT: no blurry vision, hearing changes or sore throat Respiratory: no dyspnea, coughing, wheezing CV: no chest pain, no palpitations GI: no nausea, vomiting, abdominal pain, diarrhea, constipation GU: no dysuria, burning on urination, increased urinary frequency, hematuria  Ext: has leg edema Neuro: no unilateral weakness, numbness, or tingling, no vision change or hearing loss Skin: no rash, no skin  tear. MSK: has swelling and pain in both knee and right wrist Heme: No easy bruising.  Travel history: No recent long distant travel.  Allergy:  Allergies  Allergen Reactions  . Ace Inhibitors Swelling    Facial swelling    Past Medical History:  Diagnosis Date  . Gout   . Hypertension     Past Surgical History:  Procedure Laterality Date  . APPENDECTOMY     open  . KNEE ARTHROSCOPY Left    partial revision    Social History:  reports that  has never smoked. His smokeless tobacco use includes chew. He reports that he does not drink alcohol or use drugs.  Family History:  Family History  Problem Relation Age of Onset  . Cancer Paternal Grandmother        ovarian     Prior to Admission medications   Medication Sig Start Date End Date Taking? Authorizing Provider  allopurinol (ZYLOPRIM) 100 MG tablet Take 100 mg by mouth daily.    Yes [provider]  gabapentin (NEURONTIN) 600 MG tablet Take 600 mg by mouth 3 (three) times daily.   Yes [provider]  metoprolol succinate (TOPROL-XL) 50 MG 24 hr tablet Take 50 mg by mouth daily. Take with or immediately following a meal.   Yes [provider]  Vitamin D, Ergocalciferol, (DRISDOL) 50000 units CAPS capsule Take 50,000 Units by mouth every 7 (seven) days.   Yes [provider]  cloNIDine (CATAPRES) 0.1 MG tablet Take 0.1 mg by mouth every 8 (eight) hours as needed. For systolic BP > or =  180    [provider]  hydrALAZINE (APRESOLINE) 50 MG tablet Take 50 mg by mouth 3 (three) times daily.    [provider]  methocarbamol (ROBAXIN) 500 MG tablet Take 1 tablet (500 mg total) by mouth every 6 (six) hours as needed for muscle spasms. 01/31/17   Bonnielee Haff, MD  omeprazole (PRILOSEC) 20 MG capsule Take 20 mg by mouth 2 (two) times daily before a meal.    [provider]  potassium chloride SA (K-DUR,KLOR-CON) 20 MEQ tablet Take 20 mEq by mouth daily. 02/05/17    [provider]    Physical Exam: Vitals:   03/20/17 2300 03/20/17 2323 03/20/17 2353 03/21/17 0024  BP: 117/72 (!) 136/94 105/61 129/73  Pulse: 93     Resp:  (!) 24    Temp:  98.8 F (37.1 C)    TempSrc:      SpO2: 100% 98%     General: Not in acute distress HEENT:       Eyes: PERRL, EOMI, no scleral icterus.       ENT: No discharge from the ears and nose, no pharynx injection, no tonsillar enlargement.        Neck: No JVD, no bruit, no mass felt. Heme: No neck lymph node enlargement. Cardiac: S1/S2, RRR, No murmurs, No gallops or rubs. Respiratory:  No rales, wheezing, rhonchi or rubs. GI: Soft, nondistended, nontender, no rebound pain, no organomegaly, BS present. GU: No hematuria Ext: 2+ pitting leg edema bilaterally. 2+DP/PT pulse bilaterally. Musculoskeletal: has swelling and pain in both knees (L>R) and right wrist, but no erythema Skin: No rashes.  Neuro: Alert, oriented X3, cranial nerves II-XII grossly intact, moves all extremities normally.  Psych: Patient is not psychotic, no suicidal or hemocidal ideation.  Labs on Admission: I have personally reviewed following labs and imaging studies  CBC: Recent Labs  Lab 03/20/17 1437 03/21/17 0125  WBC 15.3* 10.3  NEUTROABS 12.7*  --   HGB 8.6* 7.4*  HCT 27.2* 24.1*  MCV 84.5 84.9  PLT 314 563   Basic Metabolic Panel: Recent Labs  Lab 03/20/17 1437 03/21/17 0125  NA 140 139  K 3.7 3.8  CL 101 105  CO2 24 24  GLUCOSE 128* 137*  BUN 10 13  CREATININE 1.00 1.03  CALCIUM 9.7 8.9   GFR: CrCl cannot be calculated (Unknown ideal weight.). Liver Function Tests: Recent Labs  Lab 03/20/17 1437  AST 14*  ALT 14*  ALKPHOS 92  BILITOT 0.8  PROT 7.2  ALBUMIN 3.3*   No results for input(s): LIPASE, AMYLASE in the last 168 hours. No results for input(s): AMMONIA in the last 168 hours. Coagulation Profile: No results for input(s): INR, PROTIME in the last 168 hours. Cardiac Enzymes: No results for  input(s): CKTOTAL, CKMB, CKMBINDEX, TROPONINI in the last 168 hours. BNP (last 3 results) No results for input(s): PROBNP in the last 8760 hours. HbA1C: No results for input(s): HGBA1C in the last 72 hours. CBG: No results for input(s): GLUCAP in the last 168 hours. Lipid Profile: No results for input(s): CHOL, HDL, LDLCALC, TRIG, CHOLHDL, LDLDIRECT in the last 72 hours. Thyroid Function Tests: No results for input(s): TSH, T4TOTAL, FREET4, T3FREE, THYROIDAB in the last 72 hours. Anemia Panel: No results for input(s): VITAMINB12, FOLATE, FERRITIN, TIBC, IRON, RETICCTPCT in the last 72 hours. Urine analysis:    Component Value Date/Time   COLORURINE AMBER (A) 03/20/2017 1431   APPEARANCEUR CLEAR 03/20/2017 1431   LABSPEC 1.027 03/20/2017 1431  PHURINE 5.0 03/20/2017 1431   GLUCOSEU NEGATIVE 03/20/2017 1431   HGBUR NEGATIVE 03/20/2017 1431   BILIRUBINUR NEGATIVE 03/20/2017 1431   Sims 03/20/2017 1431   PROTEINUR 100 (A) 03/20/2017 1431   NITRITE NEGATIVE 03/20/2017 1431   LEUKOCYTESUR NEGATIVE 03/20/2017 1431   Sepsis Labs: _0 (procalcitonin:4,lacticidven:4) )No results found for this or any previous visit (from the past 240 hour(s)).   Radiological Exams on Admission: Dg Chest 2 View  Result Date: 03/20/2017 CLINICAL DATA:  Heart murmur with fever EXAM: CHEST  2 VIEW COMPARISON:  01/26/2017 FINDINGS: No acute pulmonary infiltrate or effusion. Mild cardiomegaly with prominence of the central vasculature. No pneumothorax. IMPRESSION: 1. No acute pulmonary infiltrate 2. Mild cardiomegaly Electronically Signed   By: Donavan Foil M.D.   On: 03/20/2017 20:02   Dg Hand 2 View Right  Result Date: 03/20/2017 CLINICAL DATA:  Pain right hand for while. No trauma. History of gout. EXAM: RIGHT HAND - 2 VIEW COMPARISON:  January 25, 2017 right wrist film. FINDINGS: There is no evidence of fracture or dislocation. There is subtle erosion of the carpal bones. The  previously noted hyperdensity along the dorsum of the wrist is not as well seen on today's exam. IMPRESSION: No acute abnormality. Subtle erosion of carpal bones which could be consistent with gout given history. Electronically Signed   By: Abelardo Diesel M.D.   On: 03/20/2017 15:57   Dg Knee Complete 4 Views Left  Result Date: 03/20/2017 CLINICAL DATA:  Left knee pain for while.  No trauma. EXAM: LEFT KNEE - COMPLETE 4+ VIEW COMPARISON:  January 25, 2017 FINDINGS: No evidence of fracture, dislocation. Suprapatellar effusion is noted. Patient status post prior tunneled ACL repair. Degenerative joint changes with narrowed joint space and osteophyte formation and chondrocalcinosis are identified. IMPRESSION: Osteoarthritic changes of the left knee. Electronically Signed   By: Abelardo Diesel M.D.   On: 03/20/2017 15:58   Dg Knee Complete 4 Views Right  Result Date: 03/20/2017 CLINICAL DATA:  Bilateral knee pain for while. EXAM: RIGHT KNEE - COMPLETE 4+ VIEW COMPARISON:  None. FINDINGS: No evidence of fracture, dislocation, or joint effusion. There is chondrocalcinosis of the femoral tibial joint spaces. Soft tissues are unremarkable. 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 No acute abnormality. Electronically Signed   By: Abelardo Diesel M.D.   On: 03/20/2017 15:57     EKG:  Not done in ED, will get one.   Assessment/Plan Principal Problem:   Joint pain Active Problems:   Gout, arthritis   Anemia   Hypertension   Bilateral lower extremity edema   GERD (gastroesophageal reflux disease)   SIRS (systemic inflammatory response syndrome) (HCC)   Joint pain possibly due to gout flare up and sirs: Patient's jaundiced pain, most likely caused by gout flare up. Patient also has leukocytosis, fever, tachycardia, meets criteria for SIRS vs. sepsis. Cannot completely rule out infection, such as cellulitis.  - will admit to tele bed as inpt - continue allopurinol - start  colchicine 0.6 g twice a day - start ancef empirically - prn percocet for pain - check uric acid, ESR and CRP  -will get Procalcitonin and trend lactic acid levels per sepsis protocol. - IVF: 1L of NS bolus in ED, followed by 100 cc/h   Anemia: Hemoglobin 7.2 on 1/80/19-->8.5 today. -check anemia panel  Hypertension: -Continue metoprolol, clonidine, hydralazine -IV hydralazine when necessary  Bilateral lower extremity edema: Etiology is not clear. BNP 81.2. Patient does not have pulmonary edema on  chest x-ray. No shortness of breath, does not seem to have CHF. Possibly due to lymphedema -May start diuretics after SIRS is controlled  GERD: -Protonix  DVT ppx: SQ Lovenox Code Status: Full code Family Communication: None at bed side. Disposition Plan:  Anticipate discharge back to previous home environment Consults called:  none Admission status:  Inpatient/tele     Date of Service 03/21/2017    Ivor Costa [facility] Pager [phone]  If 7PM-7AM, please contact night-coverage [email or web address] Password [facility] 03/21/2017, 2:29 AM

## 2017-03-20 NOTE — ED Triage Notes (Signed)
Per Pt, Pt is coming from home with complaints of gout pain in both legs and his right arm after eating [place] food on Saturday. Yesterday he started to have bilateral leg swelling and increase pain. Pt was given 50 mcg of Fentanyl with 18 Gauge IV. Vitals per EMS:  98%, 120 HR, 160/114.

## 2017-03-20 NOTE — ED Notes (Signed)
Pt made aware of need for urine sample. Pt unaware to provide urine sample at this time.

## 2017-03-20 NOTE — ED Notes (Signed)
Patient aware of the need for urine sample, unable to provide sample at this time.

## 2017-03-20 NOTE — ED Notes (Signed)
Spread mayo and mustard on [place]key sandwich and spread peanut butter on graham crackers. Opened apple sauce for pt.

## 2017-03-20 NOTE — ED Provider Notes (Signed)
Patient placed in Quick Look pathway, seen and evaluated   Chief Complaint: gout pain in R hand and b/l knees/feet  HPI:   Pt reports 2 days of pain in R hand and b/l knees and feet which started 2 days after eating chinese food, states it feels like his gout pain. He took a medication for gout (unknown name) but it didn't help. Reports swelling and warmth in the affected areas. Denies erythema or fevers, denies URI symptoms or cough, CP, SOB, abd pain, n/v, or any other associated symptoms  ROS: +R hand and b/l knee/foot pain and swelling, +warmth to areas, no erythema to areas, no fevers, URI symptoms, cough, CP, SOB, abd pain, n/v   Physical Exam:   Gen: No distress, febrile to 100.6, tachycardic in the 110s, but doesn't appear   overtly septic appearing  Neuro: Awake and Alert  Skin: Warm    Focused Exam: R hand with diffuse tenderness and swelling, no erythema or significant warmth. R knee with mild swelling, L knee with moderate swelling/effusion, diffuse tenderness to both knee joint lines. No erythema noted to knees. 1-2+ pitting edema in both lower legs. Distal pulses intact bilaterally. Sensation grossly intact in all extremities.    Initiation of care has begun. The patient has been counseled on the process, plan, and necessity for staying for the completion/evaluation, and the remainder of the medical screening examination    [address], [place], [place] 03/20/17 [address]    Kristy Schomburg, [place], [place]PA-C 03/21/17 0301    Terrilee FilesButler, Michael C, MD 03/22/17 [phone]

## 2017-03-20 NOTE — ED Provider Notes (Signed)
Emergency Department Provider Note   I have reviewed the triage vital signs and the nursing notes.   HISTORY  Chief Complaint Gout   HPI Deshawn Witty is a 52 y.o. male with PMH of HTN and Gout to the emergency department for evaluation of pain in both legs, right hand, left ring finger with swelling in these areas.  The patient thought he was having a gout flare which she gets frequently.  He began taking Allopurinol with no relief in symptoms.  Pain and swelling continued to worsen and presented to the emergency department today.  He has not been aware of any fevers at home.  He denies any chest pain, difficulty breathing, productive cough.  No nausea, vomiting, diarrhea.  Denies headaches.  No sick contacts.  Patient does not use IV drugs.    Past Medical History:  Diagnosis Date  . Gout   . Hypertension     Patient Active Problem List   Diagnosis Date Noted  . Elevated alkaline phosphatase level 02/21/2017  . Bilateral lower extremity edema 02/17/2017  . Left lower lobe pneumonia (HCC) 02/01/2017  . Hypokalemia 01/24/2017  . Gout attack 01/24/2017  . Gastritis 01/24/2017  . h/o recent GIB (gastrointestinal bleeding) 01/24/2017  . Hypomagnesemia 01/24/2017  . Anemia   . Hypertension     Past Surgical History:  Procedure Laterality Date  . APPENDECTOMY     open  . KNEE ARTHROSCOPY Left    partial revision    Current Outpatient Rx  . Order #: [id number] Class: Historical Med  . Order #: [id number] Class: Historical Med  . Order #: [id number] Class: Historical Med  . Order #: [id number] Class: Historical Med  . Order #: [id number] Class: Historical Med  . Order #: [id number] Class: Historical Med  . Order #: [id number] Class: Print  . Order #: [id number] Class: Historical Med  . Order #: [id number] Class: Historical Med    Allergies Ace inhibitors  Family History  Problem Relation Age of Onset  . Cancer Paternal Grandmother        ovarian    Social History Social  History   Tobacco Use  . Smoking status: Never Smoker  . Smokeless tobacco: Current User    Types: Chew  Substance Use Topics  . Alcohol use: No    Frequency: Never  . Drug use: No    Review of Systems  Constitutional: No fever/chills Eyes: No visual changes. ENT: No sore throat. Cardiovascular: Denies chest pain. Respiratory: Denies shortness of breath. Gastrointestinal: No abdominal pain.  No nausea, no vomiting.  No diarrhea.  No constipation. Genitourinary: Negative for dysuria. Musculoskeletal: Negative for back pain. Positive pain and swelling in multiple joints.  Skin: Negative for rash. Neurological: Negative for headaches, focal weakness or numbness.  10-point ROS otherwise negative.  ____________________________________________   PHYSICAL EXAM:  VITAL SIGNS: ED Triage Vitals  Enc Vitals Group     BP 03/20/17 1409 (!) 159/82     Pulse Rate 03/20/17 1409 (!) 114     Resp 03/20/17 1409 17     Temp 03/20/17 1407 (!) 100.6 F (38.1 C)     Temp Source 03/20/17 1407 Oral     SpO2 03/20/17 1409 98 %     Pain Score 03/20/17 1408 8   Constitutional: Alert and oriented. Well appearing and in no acute distress. Somewhat diaphoretic.  Eyes: Conjunctivae are normal.  Head: Atraumatic. Nose: No congestion/rhinnorhea. Mouth/Throat: Mucous membranes are moist.  Neck: No stridor.  Cardiovascular: Tachycardia. Good peripheral circulation. Faint systolic ejection murmur are  the left sternal border.   Respiratory: Normal respiratory effort.  No retractions. Lungs CTAB. Gastrointestinal: Soft and nontender. No distention.  Musculoskeletal: No lower extremity tenderness. No gross deformities of extremities. B/L LE edema. No specific knee/ankle/toe erythema or warmth. Swelling over the entire right hand without redness. Left ring finger swelling with mild erythema.  Neurologic:  Normal speech and language. No gross focal neurologic deficits are appreciated.  Skin:  Skin is  warm, dry and intact. No rash noted.  ____________________________________________   LABS (all labs ordered are listed, but only abnormal results are displayed)  Labs Reviewed  CBC WITH DIFFERENTIAL/PLATELET - Abnormal; Notable for the following components:      Result Value   WBC 15.3 (*)    RBC 3.22 (*)    Hemoglobin 8.6 (*)    HCT 27.2 (*)    RDW 22.0 (*)    Neutro Abs 12.7 (*)    All other components within normal limits  COMPREHENSIVE METABOLIC PANEL - Abnormal; Notable for the following components:   Glucose, Bld 128 (*)    Albumin 3.3 (*)    AST 14 (*)    ALT 14 (*)    All other components within normal limits  CULTURE, BLOOD (ROUTINE X 2)  CULTURE, BLOOD (ROUTINE X 2)  URINE CULTURE  URINALYSIS, ROUTINE W REFLEX MICROSCOPIC  SEDIMENTATION RATE  C-REACTIVE PROTEIN  I-STAT CG4 LACTIC ACID, ED   ____________________________________________  RADIOLOGY  Dg Chest 2 View  Result Date: 03/20/2017 CLINICAL DATA:  Heart murmur with fever EXAM: CHEST  2 VIEW COMPARISON:  01/26/2017 FINDINGS: No acute pulmonary infiltrate or effusion. Mild cardiomegaly with prominence of the central vasculature. No pneumothorax. IMPRESSION: 1. No acute pulmonary infiltrate 2. Mild cardiomegaly Electronically Signed   By: Jasmine Pang M.D.   On: 03/20/2017 20:02   Dg Hand 2 View Right  Result Date: 03/20/2017 CLINICAL DATA:  Pain right hand for while. No trauma. History of gout. EXAM: RIGHT HAND - 2 VIEW COMPARISON:  January 25, 2017 right wrist film. FINDINGS: There is no evidence of fracture or dislocation. There is subtle erosion of the carpal bones. The previously noted hyperdensity along the dorsum of the wrist is not as well seen on today's exam. IMPRESSION: No acute abnormality. Subtle erosion of carpal bones which could be consistent with gout given history. Electronically Signed   By: Sherian Rein M.D.   On: 03/20/2017 15:57   Dg Knee Complete 4 Views Left  Result Date:  03/20/2017 CLINICAL DATA:  Left knee pain for while.  No trauma. EXAM: LEFT KNEE - COMPLETE 4+ VIEW COMPARISON:  January 25, 2017 FINDINGS: No evidence of fracture, dislocation. Suprapatellar effusion is noted. Patient status post prior tunneled ACL repair. Degenerative joint changes with narrowed joint space and osteophyte formation and chondrocalcinosis are identified. IMPRESSION: Osteoarthritic changes of the left knee. Electronically Signed   By: Sherian Rein M.D.   On: 03/20/2017 15:58   Dg Knee Complete 4 Views Right  Result Date: 03/20/2017 CLINICAL DATA:  Bilateral knee pain for while. EXAM: RIGHT KNEE - COMPLETE 4+ VIEW COMPARISON:  None. FINDINGS: No evidence of fracture, dislocation, or joint effusion. There is chondrocalcinosis of the femoral tibial joint spaces. Soft tissues are unremarkable. 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 No acute abnormality. Electronically Signed   By: Sherian Rein M.D.   On: 03/20/2017 15:57    ____________________________________________   PROCEDURES  Procedure(s) performed:   Procedures  None ____________________________________________   INITIAL IMPRESSION /  ASSESSMENT AND PLAN / ED COURSE  Pertinent labs & imaging results that were available during my care of the patient were reviewed by me and considered in my medical decision making (see chart for details).  Patient presents to the emergency department with what he thought was a gout flare.  He is having swelling and pain in both legs, right hand, left ring finger.  States these are typical locations for his gout symptoms.  He has been taking his allopurinol with no relief.  Found to have a fever here in the emergency department, Tmax 101.  No other infection symptoms.  During a prior admission in late December 2018 he presented with gout flare mainly in the left knee but was found to have an associated pneumonia.  My exam I do appreciate a faint systolic ejection  murmur.  Patient is low risk for endocarditis but this remains a consideration given his fever. Added CXR and additional labs.   CXR reviewed with no acute findings. Labs reviewed. Plan for admit for ECHO with plan to follow cultures prior to starting abx. No concern for sepsis. Updated the patient who is in agreement with the plan.   Discussed patient's case with Hospitalist, Dr. Clyde LundborgNiu to request admission. Patient and family (if present) updated with plan. Care transferred to Hospitalist service.  I reviewed all nursing notes, vitals, pertinent old records, EKGs, labs, imaging (as available).  ____________________________________________  FINAL CLINICAL IMPRESSION(S) / ED DIAGNOSES  Final diagnoses:  Fever, unspecified fever cause  Heart murmur  Gout, arthritis    MEDICATIONS GIVEN DURING THIS VISIT:  Medications  sodium chloride 0.9 % bolus 1,000 mL (0 mLs Intravenous Stopped 03/20/17 2128)  acetaminophen (TYLENOL) tablet 1,000 mg (1,000 mg Oral Given 03/20/17 1848)    Note:  This document was prepared using Dragon voice recognition software and may include unintentional dictation errors.  Alona BeneJoshua Long, MD Emergency Medicine    Long, Arlyss RepressJoshua G, MD 03/20/17 2135

## 2017-03-21 ENCOUNTER — Other Ambulatory Visit: Payer: Self-pay

## 2017-03-21 LAB — CBC
HEMATOCRIT: 24.1 % — AB (ref 39.0–52.0)
HEMOGLOBIN: 7.4 g/dL — AB (ref 13.0–17.0)
MCH: 26.1 pg (ref 26.0–34.0)
MCHC: 30.7 g/dL (ref 30.0–36.0)
MCV: 84.9 fL (ref 78.0–100.0)
Platelets: 236 10*3/uL (ref 150–400)
RBC: 2.84 MIL/uL — AB (ref 4.22–5.81)
RDW: 22 % — ABNORMAL HIGH (ref 11.5–15.5)
WBC: 10.3 10*3/uL (ref 4.0–10.5)

## 2017-03-21 LAB — URINALYSIS, ROUTINE W REFLEX MICROSCOPIC
BACTERIA UA: NONE SEEN
BILIRUBIN URINE: NEGATIVE
Glucose, UA: NEGATIVE mg/dL
Hgb urine dipstick: NEGATIVE
Ketones, ur: NEGATIVE mg/dL
Leukocytes, UA: NEGATIVE
Nitrite: NEGATIVE
Protein, ur: 100 mg/dL — AB
SPECIFIC GRAVITY, URINE: 1.027 (ref 1.005–1.030)
pH: 5 (ref 5.0–8.0)

## 2017-03-21 LAB — CBC WITH DIFFERENTIAL/PLATELET
BASOS PCT: 0 %
Basophils Absolute: 0 10*3/uL (ref 0.0–0.1)
EOS PCT: 0 %
Eosinophils Absolute: 0 10*3/uL (ref 0.0–0.7)
HCT: 24.4 % — ABNORMAL LOW (ref 39.0–52.0)
HEMOGLOBIN: 7.6 g/dL — AB (ref 13.0–17.0)
LYMPHS PCT: 8 %
Lymphs Abs: 0.9 10*3/uL (ref 0.7–4.0)
MCH: 26.2 pg (ref 26.0–34.0)
MCHC: 31.1 g/dL (ref 30.0–36.0)
MCV: 84.1 fL (ref 78.0–100.0)
Monocytes Absolute: 1.4 10*3/uL — ABNORMAL HIGH (ref 0.1–1.0)
Monocytes Relative: 13 %
NEUTROS PCT: 79 %
Neutro Abs: 8.4 10*3/uL — ABNORMAL HIGH (ref 1.7–7.7)
Platelets: 269 10*3/uL (ref 150–400)
RBC: 2.9 MIL/uL — AB (ref 4.22–5.81)
RDW: 21.9 % — ABNORMAL HIGH (ref 11.5–15.5)
WBC: 10.7 10*3/uL — ABNORMAL HIGH (ref 4.0–10.5)

## 2017-03-21 LAB — LACTIC ACID, PLASMA
Lactic Acid, Venous: 1.1 mmol/L (ref 0.5–1.9)
Lactic Acid, Venous: 1.2 mmol/L (ref 0.5–1.9)

## 2017-03-21 LAB — BASIC METABOLIC PANEL
ANION GAP: 10 (ref 5–15)
ANION GAP: 11 (ref 5–15)
BUN: 13 mg/dL (ref 6–20)
BUN: 15 mg/dL (ref 6–20)
CHLORIDE: 104 mmol/L (ref 101–111)
CO2: 24 mmol/L (ref 22–32)
CO2: 25 mmol/L (ref 22–32)
Calcium: 8.9 mg/dL (ref 8.9–10.3)
Calcium: 9 mg/dL (ref 8.9–10.3)
Chloride: 105 mmol/L (ref 101–111)
Creatinine, Ser: 1.03 mg/dL (ref 0.61–1.24)
Creatinine, Ser: 1.13 mg/dL (ref 0.61–1.24)
GFR calc Af Amer: >60 mL/min (ref >60)
GFR calc non Af Amer: >60 mL/min (ref >60)
GLUCOSE: 155 mg/dL — AB (ref 65–99)
Glucose, Bld: 137 mg/dL — ABNORMAL HIGH (ref 65–99)
POTASSIUM: 3.8 mmol/L (ref 3.5–5.1)
POTASSIUM: 4.1 mmol/L (ref 3.5–5.1)
SODIUM: 139 mmol/L (ref 135–145)
SODIUM: 140 mmol/L (ref 135–145)

## 2017-03-21 LAB — BRAIN NATRIURETIC PEPTIDE: B Natriuretic Peptide: 81.2 pg/mL (ref 0.0–100.0)

## 2017-03-21 LAB — HIV ANTIBODY (ROUTINE TESTING W REFLEX): HIV SCREEN 4TH GENERATION: NONREACTIVE

## 2017-03-21 LAB — PROCALCITONIN: Procalcitonin: 0.58 ng/mL

## 2017-03-21 LAB — URIC ACID: URIC ACID, SERUM: 6.8 mg/dL (ref 4.4–7.6)

## 2017-03-21 MED ORDER — SODIUM CHLORIDE 0.9 % IV SOLN
INTRAVENOUS | Status: DC
  Administered 2017-03-21 – 2017-03-22 (×2): via INTRAVENOUS

## 2017-03-21 MED ORDER — CEFAZOLIN SODIUM-DEXTROSE 2-4 GM/100ML-% IV SOLN
2.0000 g | Freq: Three times a day (TID) | INTRAVENOUS | Status: DC
Start: 2017-03-21 — End: 2017-03-22
  Administered 2017-03-21 – 2017-03-22 (×4): 2 g via INTRAVENOUS
  Filled 2017-03-21 (×5): qty 100

## 2017-03-21 MED ORDER — METHYLPREDNISOLONE SODIUM SUCC 125 MG IJ SOLR
60.0000 mg | Freq: Two times a day (BID) | INTRAMUSCULAR | Status: DC
Start: 2017-03-21 — End: 2017-03-23
  Administered 2017-03-21 – 2017-03-23 (×5): 60 mg via INTRAVENOUS
  Filled 2017-03-21 (×5): qty 2

## 2017-03-21 NOTE — Evaluation (Signed)
Physical Therapy Evaluation Patient Details Name: Scott [name] MRN: [id number] DOB: 12/20/1965 Today's Date: 03/21/2017   History of Present Illness  Scott [name] is a 52 y.o. male with medical history significant of gout, hypertension, GI bleeding, GERD, who presents with joint pain and swelling in bilateral knees and right wrist.   Clinical Impression  Pt admitted with/for joint pain and swelling of bil LE's and R UE's.  Work up includes Gout and ?SIRS.  Pt needs extra time and at least min assist to mobilize.Marland Kitchen.  Pt currently limited functionally due to the problems listed below.  (see problems list.)  Pt will benefit from PT to maximize function and safety to be able to get home safely with available assist.     Follow Up Recommendations Home health PT;Supervision/Assistance - 24 hour;Other (comment)(24 hour assist initially.  HHPT assumes gout reduced)    Equipment Recommendations  Rolling walker with 5" wheels    Recommendations for Other Services       Precautions / Restrictions        Mobility  Bed Mobility                  Transfers Overall transfer level: Needs assistance   Transfers: Sit to/from Stand Sit to Stand: Min guard;From elevated surface         General transfer comment: effortful, slow stand at EOB to change bedding and underwear while completing hygiene care.  Ambulation/Gait             General Gait Details: side stepped up to [facility] foot with min guard.  Stairs            Wheelchair Mobility    Modified Rankin (Stroke Patients Only)       Balance Overall balance assessment: Needs assistance   Sitting balance-Leahy Scale: Good       Standing balance-Leahy Scale: Poor Standing balance comment: needed 1 UE support.                             Pertinent Vitals/Pain Pain Assessment: Faces Faces Pain Scale: Hurts even more Pain Location: left knee worse than R knee, R wrist/shd/elbow  Pain Descriptors /  Indicators: Sharp;Sore;Tender;Grimacing;Guarding Pain Intervention(s): Limited activity within patient's tolerance;Monitored during session    Home Living Family/patient expects to be discharged to:: Private residence Living Arrangements: Spouse/significant other Available Help at Discharge: Family;Available PRN/intermittently Type of Home: House Home Access: Stairs to enter Entrance Stairs-Rails: Right;Left;Can reach both Entrance Stairs-Number of Steps: 3 Home Layout: Bed/bath upstairs;Able to live on main level with bedroom/bathroom;1/2 bath on main level Home Equipment: Cane - single point      Prior Function Level of Independence: Independent         Comments: Has struggled since 01/17/17, when gout flare and consequences became more prevalent.     Hand Dominance   Dominant Hand: Right    Extremity/Trunk Assessment   Upper Extremity Assessment Upper Extremity Assessment: RUE deficits/detail;LUE deficits/detail RUE Deficits / Details: limited range in wrist and shoulder more than elbow RUE: Unable to fully assess due to pain LUE Deficits / Details: WFL, gout fingers    Lower Extremity Assessment Lower Extremity Assessment: RLE deficits/detail;LLE deficits/detail RLE Deficits / Details: gouty knee, can attain 0-60 * aarom, can maintain w/bearing EOB LLE Deficits / Details: gouty knee and instep.  knee AAROM -5-60*       Communication   Communication: No difficulties  Cognition Arousal/Alertness:  Awake/alert Behavior During Therapy: WFL for tasks assessed/performed Overall Cognitive Status: Within Functional Limits for tasks assessed                                        General Comments      Exercises     Assessment/Plan    PT Assessment Patient needs continued PT services  PT Problem List Decreased strength;Decreased activity tolerance;Decreased balance;Decreased mobility;Decreased knowledge of use of DME;Pain       PT Treatment  Interventions DME instruction;Gait training;Stair training;Functional mobility training;Therapeutic activities;Balance training;Patient/family education    PT Goals (Current goals can be found in the Care Plan section)  Acute Rehab PT Goals Patient Stated Goal: get this gout under control, Get home PT Goal Formulation: With patient Time For Goal Achievement: 04/04/17 Potential to Achieve Goals: Good    Frequency Min 3X/week   Barriers to discharge        Co-evaluation               AM-PAC PT "6 Clicks" Daily Activity  Outcome Measure Difficulty turning over in bed (including adjusting bedclothes, sheets and blankets)?: Unable Difficulty moving from lying on back to sitting on the side of the bed? : Unable Difficulty sitting down on and standing up from a chair with arms (e.g., wheelchair, bedside commode, etc,.)?: Unable Help needed moving to and from a bed to chair (including a wheelchair)?: A Little Help needed walking in hospital room?: A Lot Help needed climbing 3-5 steps with a railing? : Total 6 Click Score: 9    End of Session   Activity Tolerance: Patient tolerated treatment well Patient left: in bed;with call bell/phone within reach;with bed alarm set Nurse Communication: Mobility status PT Visit Diagnosis: Other abnormalities of gait and mobility (R26.89);Muscle weakness (generalized) (M62.81)    Time: 1610-9604 PT Time Calculation (min) (ACUTE ONLY): 40 min   Charges:   PT Evaluation $PT Eval Moderate Complexity: 1 Mod PT Treatments $Therapeutic Activity: 23-37 mins   PT G Codes:        04/05/2017  Bel[place] Bing, PT [phone]  (pager)  Scott [name] 2017/04/05, 6:22 PM

## 2017-03-21 NOTE — Progress Notes (Signed)
PROGRESS NOTE    Patient: Scott [name]     PCP: Patient, No Pcp Per                    DOB: May 03, 1965            DOA: 03/20/2017 NWG:[id number]             DOS: 03/21/2017, 1:27 PM   LOS: 1 day   Date of Service: The patient was seen and examined on 03/21/2017  Subjective:   Patient was seen and examined this morning, overall stable, still complaining of right arm pain swelling arm and lower extremities joint pain especially wrist area shoulder. Complaining of discomfort with pain, and limited mobility especially in the right arm secondary to joint pain on her right arm and his knees. T-max this a.m. 100.1 ----------------------------------------------------------------------------------------------------------------------  Brief Narrative:  Scott [name] is a 52 y.o. male with medical history significant of gout, hypertension, GI bleeding, GERD, who presents with joint pain and swelling in bilateral knees and right wrist.   Pt state that he has gout flare up in the past several days. He has swelling and severe pain in bilateral knees (the left is worse than the right) and right wrist. He also has fever and chill. He states that these symptoms are typical for his gout flareup. Currently the pain is constant, 9 out of 10 in severity. Patient denies chest pain, shortness breath, cough, no nausea, vomiting, diarrhea or abdominal pain. No symptoms of UTI. Patient has chronic bilateral leg edema  WBC was elevated as high as 15,000 on admission, he was febrile X-ray of the right hand findings consistent with gout, x-ray of the left knee and right knee showing osteoarthritis, chondrocalcinosis ----------------------------------------------------------------------------------------------------------------- Assessment & Plan:   SIRS (systemic inflammatory response syndrome) ([place]) - -Patient with initial criteria on admission with fever and leukocytosis -Still febrile with temp 100.1, improved  leukocytosis -Continue IV antibiotics of cefepime -Elevated ESR and CRP, -Continue IV fluid hydration -Follow-up with labs lactic acid within normal limits  Multiple joint pain/gouty arthritis flareup/possible cellulitis with erythema and edema of the right arm/wrist -We will reduce IV fluid hydration -Continue allopurinol/colchicine -Steroids  History of chronic anemia -We will monitor H&H, hemoglobin 8.5 -Anemia panel  Hypertension -Stable continue with Toprol, hydralazine   Bilateral lower extremity edema  -Unclear etiology, proBNP 81.2, CHF pulmonary edema. -We cannot rule out lymphedema -Monitor closely, PRN diuretics    GERD (gastroesophageal reflux disease) -Continue Protonix  DVT ppx: SQ Lovenox Code Status: Full code Family Communication: None at bed side. Disposition Plan:  Anticipate discharge back to previous home environment  Consultants:   None   Procedures:  No admission procedures for hospital encounter.    Antimicrobials:  Anti-infectives (From admission, onward)   Start     Dose/Rate Route Frequency Ordered Stop   03/21/17 0715  ceFAZolin (ANCEF) IVPB 2g/100 mL premix     2 g 200 mL/hr over 30 Minutes Intravenous Every 8 hours 03/21/17 0655        Objective: Vitals:   03/20/17 2323 03/20/17 2353 03/21/17 0024 03/21/17 0453  BP: (!) 136/94 105/61 129/73 121/71  Pulse:    98  Resp: (!) 24   18  Temp: 98.8 F (37.1 C)   100.1 F (37.8 C)  TempSrc:    Oral  SpO2: 98%   97%  Weight:    111.6 kg (246 lb 0.5 oz)    Intake/Output Summary (Last 24 hours) at 03/21/2017 1327  Last data filed at 03/21/2017 0755 Gross per 24 hour  Intake 1831.67 ml  Output 500 ml  Net 1331.67 ml   Filed Weights   03/21/17 0453  Weight: 111.6 kg (246 lb 0.5 oz)    Examination:  General exam: Appears calm and comfortable  Psychiatry: Judgement and insight appear normal. Mood & affect appropriate. HEENT: WNLs Respiratory system: Clear to auscultation.  Respiratory effort normal. Cardiovascular system: S1 & S2 heard, RRR. No JVD, murmurs, rubs, gallops or clicks. No pedal edema. Gastrointestinal system: Abd. nondistended, soft and nontender. No organomegaly or masses felt. Normal bowel sounds heard. Central nervous system: Alert and oriented. No focal neurological deficits. Extremities: Symmetric 5 x 5 power.  +2 edema right arm and bilateral lower extremities, right wrist elbow and shoulder pain tenderness erythema, limited range of motion secondary to pain.  Bilateral knee, ankle swelling edema Skin: No rashes, lesions or ulcers Wounds: If any open wounds, mild erythema edema right and bilateral knee and lower extremity area  Data Reviewed: I have personally reviewed following labs and imaging studies  CBC: Recent Labs  Lab 03/20/17 1437 03/21/17 0125 03/21/17 1001  WBC 15.3* 10.3 10.7*  NEUTROABS 12.7*  --  8.4*  HGB 8.6* 7.4* 7.6*  HCT 27.2* 24.1* 24.4*  MCV 84.5 84.9 84.1  PLT 314 236 703   Basic Metabolic Panel: Recent Labs  Lab 03/20/17 1437 03/21/17 0125 03/21/17 1001  NA 140 139 140  K 3.7 3.8 4.1  CL 101 105 104  CO2 '24 24 25  ' GLUCOSE 128* 137* 155*  BUN '10 13 15  ' CREATININE 1.00 1.03 1.13  CALCIUM 9.7 8.9 9.0   GFR: Estimated Creatinine Clearance: 108.5 mL/min (by C-G formula based on SCr of 1.13 mg/dL). Liver Function Tests: Recent Labs  Lab 03/20/17 1437  AST 14*  ALT 14*  ALKPHOS 92  BILITOT 0.8  PROT 7.2  ALBUMIN 3.3*   Sepsis Labs: Recent Labs  Lab 03/20/17 1516 03/20/17 2333 03/21/17 0125  PROCALCITON  --  0.58  --   LATICACIDVEN 0.91 1.1 1.2    No results found for this or any previous visit (from the past 240 hour(s)).    Radiology Studies: Dg Chest 2 View  Result Date: 03/20/2017 CLINICAL DATA:  Heart murmur with fever EXAM: CHEST  2 VIEW COMPARISON:  01/26/2017 FINDINGS: No acute pulmonary infiltrate or effusion. Mild cardiomegaly with prominence of the central vasculature. No  pneumothorax. IMPRESSION: 1. No acute pulmonary infiltrate 2. Mild cardiomegaly Electronically Signed   By: Donavan Foil M.D.   On: 03/20/2017 20:02   Dg Hand 2 View Right  Result Date: 03/20/2017 CLINICAL DATA:  Pain right hand for while. No trauma. History of gout. EXAM: RIGHT HAND - 2 VIEW COMPARISON:  January 25, 2017 right wrist film. FINDINGS: There is no evidence of fracture or dislocation. There is subtle erosion of the carpal bones. The previously noted hyperdensity along the dorsum of the wrist is not as well seen on today's exam. IMPRESSION: No acute abnormality. Subtle erosion of carpal bones which could be consistent with gout given history. Electronically Signed   By: Abelardo Diesel M.D.   On: 03/20/2017 15:57   Dg Knee Complete 4 Views Left  Result Date: 03/20/2017 CLINICAL DATA:  Left knee pain for while.  No trauma. EXAM: LEFT KNEE - COMPLETE 4+ VIEW COMPARISON:  January 25, 2017 FINDINGS: No evidence of fracture, dislocation. Suprapatellar effusion is noted. Patient status post prior tunneled ACL repair. Degenerative joint changes  with narrowed joint space and osteophyte formation and chondrocalcinosis are identified. IMPRESSION: Osteoarthritic changes of the left knee. Electronically Signed   By: Abelardo Diesel M.D.   On: 03/20/2017 15:58   Dg Knee Complete 4 Views Right  Result Date: 03/20/2017 CLINICAL DATA:  Bilateral knee pain for while. EXAM: RIGHT KNEE - COMPLETE 4+ VIEW COMPARISON:  None. FINDINGS: No evidence of fracture, dislocation, or joint effusion. There is chondrocalcinosis of the femoral tibial joint spaces. Soft tissues are unremarkable. 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 No acute abnormality. Electronically Signed   By: Abelardo Diesel M.D.   On: 03/20/2017 15:57    Scheduled Meds: . allopurinol  100 mg Oral Daily  . cloNIDine  0.1 mg Oral TID  . colchicine  0.6 mg Oral BID  . enoxaparin (LOVENOX) injection  40 mg Subcutaneous Q24H    . gabapentin  600 mg Oral TID  . hydrALAZINE  50 mg Oral TID  . methylPREDNISolone (SOLU-MEDROL) injection  60 mg Intravenous Q12H  . metoprolol succinate  50 mg Oral Daily  . pantoprazole  40 mg Oral Daily   Continuous Infusions: . sodium chloride 100 mL/hr at 03/21/17 0300  .  ceFAZolin (ANCEF) IV Stopped (03/21/17 0810)    Time spent: >25 minutes  Deatra James, MD [facility],  Pager [phone]  If 7PM-7AM, please contact night-coverage [email or web address]   Password [email or web address]  03/21/2017, 1:27 PM

## 2017-03-22 DIAGNOSIS — D500 Iron deficiency anemia secondary to blood loss (chronic): Secondary | ICD-10-CM | POA: Diagnosis present

## 2017-03-22 LAB — FERRITIN: FERRITIN: 542 ng/mL — AB (ref 24–336)

## 2017-03-22 LAB — IRON AND TIBC
IRON: 9 ug/dL — AB (ref 45–182)
Saturation Ratios: 4 % — ABNORMAL LOW (ref 17.9–39.5)
TIBC: 202 ug/dL — ABNORMAL LOW (ref 250–450)
UIBC: 193 ug/dL

## 2017-03-22 LAB — CBC
HCT: 25.8 % — ABNORMAL LOW (ref 39.0–52.0)
Hemoglobin: 7.8 g/dL — ABNORMAL LOW (ref 13.0–17.0)
MCH: 25.6 pg — ABNORMAL LOW (ref 26.0–34.0)
MCHC: 30.2 g/dL (ref 30.0–36.0)
MCV: 84.6 fL (ref 78.0–100.0)
PLATELETS: 270 10*3/uL (ref 150–400)
RBC: 3.05 MIL/uL — ABNORMAL LOW (ref 4.22–5.81)
RDW: 21.5 % — AB (ref 11.5–15.5)
WBC: 10.1 10*3/uL (ref 4.0–10.5)

## 2017-03-22 LAB — RETICULOCYTES
RBC.: 2.95 MIL/uL — ABNORMAL LOW (ref 4.22–5.81)
RETIC COUNT ABSOLUTE: 26.6 10*3/uL (ref 19.0–186.0)
Retic Ct Pct: 0.9 % (ref 0.4–3.1)

## 2017-03-22 LAB — OCCULT BLOOD X 1 CARD TO LAB, STOOL: FECAL OCCULT BLD: NEGATIVE

## 2017-03-22 LAB — FOLATE: Folate: 8.9 ng/mL (ref >5.9)

## 2017-03-22 LAB — VITAMIN B12: Vitamin B-12: 236 pg/mL (ref 180–914)

## 2017-03-22 LAB — SAVE SMEAR

## 2017-03-22 MED ORDER — FUROSEMIDE 10 MG/ML IJ SOLN
40.0000 mg | Freq: Once | INTRAMUSCULAR | Status: AC
Start: 2017-03-22 — End: 2017-03-22
  Administered 2017-03-22: 40 mg via INTRAVENOUS
  Filled 2017-03-22: qty 4

## 2017-03-22 MED ORDER — GABAPENTIN 600 MG PO TABS
300.0000 mg | ORAL_TABLET | Freq: Three times a day (TID) | ORAL | Status: DC
  Administered 2017-03-22 – 2017-03-23 (×4): 300 mg via ORAL
  Filled 2017-03-22 (×4): qty 1

## 2017-03-22 MED ORDER — FUROSEMIDE 10 MG/ML IJ SOLN
40.0000 mg | Freq: Once | INTRAMUSCULAR | Status: AC
  Administered 2017-03-22: 40 mg via INTRAVENOUS
  Filled 2017-03-22: qty 4

## 2017-03-22 MED ORDER — FERROUS SULFATE 325 (65 FE) MG PO TABS
325.0000 mg | ORAL_TABLET | Freq: Two times a day (BID) | ORAL | Status: DC
  Administered 2017-03-22 – 2017-03-23 (×3): 325 mg via ORAL
  Filled 2017-03-22 (×3): qty 1

## 2017-03-22 MED ORDER — SODIUM CHLORIDE 0.9 % IV SOLN
250.0000 mg | Freq: Once | INTRAVENOUS | Status: AC
  Administered 2017-03-22: 250 mg via INTRAVENOUS
  Filled 2017-03-22: qty 20

## 2017-03-22 NOTE — Progress Notes (Signed)
Nutrition Consult   Received consult from RN for diet education on good sources of iron and foods to avoid for gout. Diet education provided. Patient declined handouts. No further nutrition intervention needed at this time.   Joaquin CourtsKimberly Harris, RD, LDN, CNSC Pager [phone] After Hours Pager [phone]

## 2017-03-22 NOTE — Progress Notes (Signed)
PROGRESS NOTE    Patient: Scott [name]     PCP: Patient, No Pcp Per                    DOB: 10/10/1965            DOA: 03/20/2017 GNO:[id number]             DOS: 03/22/2017, 3:04 PM   LOS: 2 days   Date of Service: The patient was seen and examined on 03/22/2017  Subjective:   Patient was seen and examined this morning, overnight low-grade temp 99.1 of negative any leukocytosis.  Reporting of excessive swelling in his right arm, bilateral ankle lower extremity area. Reporting improved joint pain. Complaint of generalized weaknesses.   ----------------------------------------------------------------------------------------------------------------------  Brief Narrative:  Derryck Shahan is a 52 y.o. male with medical history significant of gout, hypertension, GI bleeding, GERD, who presents with joint pain and swelling in bilateral knees and right wrist.   Pt state that he has gout flare up in the past several days. He has swelling and severe pain in bilateral knees (the left is worse than the right) and right wrist. He also has fever and chill. He states that these symptoms are typical for his gout flareup. Currently the pain is constant, 9 out of 10 in severity. Patient denies chest pain, shortness breath, cough, no nausea, vomiting, diarrhea or abdominal pain. No symptoms of UTI. Patient has chronic bilateral leg edema  WBC was elevated as high as 15,000 on admission, he was febrile X-ray of the right hand findings consistent with gout, x-ray of the left knee and right knee showing osteoarthritis, chondrocalcinosis ----------------------------------------------------------------------------------------------------------------- Assessment & Plan:   SIRS (systemic inflammatory response syndrome) ([place]) - -Patient with initial criteria on admission with fever and leukocytosis -T-max 100.1, this a.m. 99.1, improved leukocytosis -Continue IV antibiotics of cefepime, if afebrile in a.m.  negative leukocytosis cultures negative will DC antibiotics -Elevated ESR and CRP, -D/C IV fluid hydration -Follow-up with labs lactic acid within normal limits  Multiple joint pain/gouty arthritis flareup/possible cellulitis with erythema and edema of the right arm/wrist -Status post IV fluid hydration, worsening edema therefore IV fluid will be DC'd -Continue allopurinol/colchicine -Steroids -Solu-Medrol 60 mg IV every 12 hours can be DC'd in a.m. if improved symptoms May be switched to Medrol Dosepak  History of chronic anemia -severe iron deficiency -hemoglobin 7.8 -Hemoccult negative -Iron study total iron 9 TIBC 2040 ferritin 542, folate 8.9 -1 dose of IV iron, p.o. iron was initiated, following H&H  Hypertension -Stable continue with Toprol, hydralazine  Bilateral lower extremity edema  -Unclear etiology, proBNP 81.2, -We cannot rule out lymphedema -Monitor closely, started on a 2 dose of Lasix -Monitoring closely  GERD (gastroesophageal reflux disease) -Continue Protonix  Generalized weakness -PT/OT   DVT ppx: SQ Lovenox Code Status: Full code Family Communication: None at bed side. Disposition Plan:  Anticipate discharge back to previous home environment  Consultants:   None   Procedures:  No admission procedures for hospital encounter.    Antimicrobials:  Anti-infectives (From admission, onward)   Start     Dose/Rate Route Frequency Ordered Stop   03/21/17 0715  ceFAZolin (ANCEF) IVPB 2g/100 mL premix  Status:  Discontinued     2 g 200 mL/hr over 30 Minutes Intravenous Every 8 hours 03/21/17 0655 03/22/17 0804      Objective: Vitals:   03/22/17 0003 03/22/17 0437 03/22/17 0815 03/22/17 1402  BP: 120/75 135/73 (!) 144/81 (!) 141/90  Pulse:  74  78  Resp:      Temp:  98 F (36.7 C)  98.5 F (36.9 C)  TempSrc:  Oral  Oral  SpO2:  98%  99%  Weight:  112.4 kg (247 lb 12.8 oz)      Intake/Output Summary (Last 24 hours) at 03/22/2017 1504 Last  data filed at 03/22/2017 1300 Gross per 24 hour  Intake 3581.66 ml  Output 2250 ml  Net 1331.66 ml   Filed Weights   03/21/17 0453 03/22/17 0437  Weight: 111.6 kg (246 lb 0.5 oz) 112.4 kg (247 lb 12.8 oz)    Examination:  General exam: Appears calm and comfortable  Psychiatry: Judgement and insight appear normal. Mood & affect appropriate. HEENT: WNLs Respiratory system: Clear to auscultation. Respiratory effort normal. Cardiovascular system: S1 & S2 heard, RRR. No JVD, murmurs, rubs, gallops or clicks. No pedal edema. Gastrointestinal system: Abd. nondistended, soft and nontender. No organomegaly or masses felt. Normal bowel sounds heard. Central nervous system: Alert and oriented. No focal neurological deficits. Extremities: Symmetric 5 x 5 power.  +2 edema right arm and bilateral lower extremities, right wrist elbow and shoulder pain tenderness erythema, limited range of motion secondary to pain.  Bilateral knee, ankle swelling edema Skin: No rashes, lesions or ulcers Wounds: If any open wounds, mild erythema edema right and bilateral knee and lower extremity area  Data Reviewed: I have personally reviewed following labs and imaging studies  CBC: Recent Labs  Lab 03/20/17 1437 03/21/17 0125 03/21/17 1001 03/22/17 0437  WBC 15.3* 10.3 10.7* 10.1  NEUTROABS 12.7*  --  8.4*  --   HGB 8.6* 7.4* 7.6* 7.8*  HCT 27.2* 24.1* 24.4* 25.8*  MCV 84.5 84.9 84.1 84.6  PLT 314 236 269 355   Basic Metabolic Panel: Recent Labs  Lab 03/20/17 1437 03/21/17 0125 03/21/17 1001  NA 140 139 140  K 3.7 3.8 4.1  CL 101 105 104  CO2 '24 24 25  ' GLUCOSE 128* 137* 155*  BUN '10 13 15  ' CREATININE 1.00 1.03 1.13  CALCIUM 9.7 8.9 9.0   GFR: Estimated Creatinine Clearance: 108.8 mL/min (by C-G formula based on SCr of 1.13 mg/dL). Liver Function Tests: Recent Labs  Lab 03/20/17 1437  AST 14*  ALT 14*  ALKPHOS 92  BILITOT 0.8  PROT 7.2  ALBUMIN 3.3*   Sepsis Labs: Recent Labs  Lab  03/20/17 1516 03/20/17 2333 03/21/17 0125  PROCALCITON  --  0.58  --   LATICACIDVEN 0.91 1.1 1.2    Recent Results (from the past 240 hour(s))  Blood Culture (routine x 2)     Status: None (Preliminary result)   Collection Time: 03/20/17  2:40 PM  Result Value Ref Range Status   Specimen Description BLOOD LEFT ANTECUBITAL  Final   Special Requests   Final    BOTTLES DRAWN AEROBIC AND ANAEROBIC Blood Culture adequate volume   Culture   Final    NO GROWTH < 24 HOURS Performed at [facility], [place] [address], [place], [place] [address]    Report Status PENDING  Incomplete  Blood Culture (routine x 2)     Status: None (Preliminary result)   Collection Time: 03/20/17  3:00 PM  Result Value Ref Range Status   Specimen Description BLOOD LEFT HAND  Final   Special Requests   Final    BOTTLES DRAWN AEROBIC AND ANAEROBIC Blood Culture adequate volume   Culture   Final    NO GROWTH < 24 HOURS  Performed at [facility], [place] [address], [place], [place] [address]    Report Status PENDING  Incomplete      Radiology Studies: Dg Chest 2 View  Result Date: 03/20/2017 CLINICAL DATA:  Heart murmur with fever EXAM: CHEST  2 VIEW COMPARISON:  01/26/2017 FINDINGS: No acute pulmonary infiltrate or effusion. Mild cardiomegaly with prominence of the central vasculature. No pneumothorax. IMPRESSION: 1. No acute pulmonary infiltrate 2. Mild cardiomegaly Electronically Signed   By: Donavan Foil M.D.   On: 03/20/2017 20:02   Dg Hand 2 View Right  Result Date: 03/20/2017 CLINICAL DATA:  Pain right hand for while. No trauma. History of gout. EXAM: RIGHT HAND - 2 VIEW COMPARISON:  January 25, 2017 right wrist film. FINDINGS: There is no evidence of fracture or dislocation. There is subtle erosion of the carpal bones. The previously noted hyperdensity along the dorsum of the wrist is not as well seen on today's exam. IMPRESSION: No acute abnormality. Subtle erosion of carpal bones which could  be consistent with gout given history. Electronically Signed   By: Abelardo Diesel M.D.   On: 03/20/2017 15:57   Dg Knee Complete 4 Views Left  Result Date: 03/20/2017 CLINICAL DATA:  Left knee pain for while.  No trauma. EXAM: LEFT KNEE - COMPLETE 4+ VIEW COMPARISON:  January 25, 2017 FINDINGS: No evidence of fracture, dislocation. Suprapatellar effusion is noted. Patient status post prior tunneled ACL repair. Degenerative joint changes with narrowed joint space and osteophyte formation and chondrocalcinosis are identified. IMPRESSION: Osteoarthritic changes of the left knee. Electronically Signed   By: Abelardo Diesel M.D.   On: 03/20/2017 15:58   Dg Knee Complete 4 Views Right  Result Date: 03/20/2017 CLINICAL DATA:  Bilateral knee pain for while. EXAM: RIGHT KNEE - COMPLETE 4+ VIEW COMPARISON:  None. FINDINGS: No evidence of fracture, dislocation, or joint effusion. There is chondrocalcinosis of the femoral tibial joint spaces. Soft tissues are unremarkable. 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 No acute abnormality. Electronically Signed   By: Abelardo Diesel M.D.   On: 03/20/2017 15:57    Scheduled Meds: . allopurinol  100 mg Oral Daily  . cloNIDine  0.1 mg Oral TID  . colchicine  0.6 mg Oral BID  . enoxaparin (LOVENOX) injection  40 mg Subcutaneous Q24H  . ferrous sulfate  325 mg Oral BID WC  . gabapentin  300 mg Oral TID  . hydrALAZINE  50 mg Oral TID  . methylPREDNISolone (SOLU-MEDROL) injection  60 mg Intravenous Q12H  . metoprolol succinate  50 mg Oral Daily  . pantoprazole  40 mg Oral Daily   Continuous Infusions:   Time spent: >25 minutes  Deatra James, MD [facility],  Pager [phone]  If 7PM-7AM, please contact night-coverage [email or web address]   Password [facility]  03/22/2017, 3:04 PM

## 2017-03-23 DIAGNOSIS — D649 Anemia, unspecified: Secondary | ICD-10-CM

## 2017-03-23 DIAGNOSIS — M1000 Idiopathic gout, unspecified site: Secondary | ICD-10-CM

## 2017-03-23 DIAGNOSIS — R6510 Systemic inflammatory response syndrome (SIRS) of non-infectious origin without acute organ dysfunction: Secondary | ICD-10-CM

## 2017-03-23 LAB — CBC
HCT: 26.7 % — ABNORMAL LOW (ref 39.0–52.0)
HEMOGLOBIN: 8.3 g/dL — AB (ref 13.0–17.0)
MCH: 26.2 pg (ref 26.0–34.0)
MCHC: 31.1 g/dL (ref 30.0–36.0)
MCV: 84.2 fL (ref 78.0–100.0)
PLATELETS: 291 10*3/uL (ref 150–400)
RBC: 3.17 MIL/uL — AB (ref 4.22–5.81)
RDW: 21.6 % — ABNORMAL HIGH (ref 11.5–15.5)
WBC: 11.2 10*3/uL — AB (ref 4.0–10.5)

## 2017-03-23 MED ORDER — FERROUS SULFATE 325 (65 FE) MG PO TABS: 325.0000 mg | ORAL_TABLET | Freq: Two times a day (BID) | ORAL | 0 refills | Status: AC

## 2017-03-23 MED ORDER — COLCHICINE 0.6 MG PO TABS: 0.6000 mg | ORAL_TABLET | Freq: Every day | ORAL | 0 refills | Status: AC

## 2017-03-23 MED ORDER — PREDNISONE 10 MG PO TABS: 40.0000 mg | ORAL_TABLET | Freq: Every day | ORAL | 0 refills | Status: AC

## 2017-03-23 MED ORDER — NAPROXEN 375 MG PO TABS: 375.0000 mg | ORAL_TABLET | Freq: Two times a day (BID) | ORAL | 2 refills | Status: AC | PRN

## 2017-03-23 MED ORDER — OXYCODONE-ACETAMINOPHEN 5-325 MG PO TABS: 1.0000 | ORAL_TABLET | ORAL | 0 refills | Status: AC | PRN

## 2017-03-23 NOTE — Discharge Summary (Addendum)
Physician Discharge Summary  Domnic Vantol UEA:[id number] DOB: 08-May-1965 DOA: 03/20/2017  PCP: Patient, No Pcp Per  Admit date: 03/20/2017 Discharge date: 03/23/2017  Time spent: 30 minutes  Recommendations for Outpatient Follow-up:  1. Referral to Rheum 2. PCP follow-up in 7 days 3. Discuss ongoing anemia with PCP and pain control.  Discharge Diagnoses:  Principal Problem:   SIRS (systemic inflammatory response syndrome) (HCC) Active Problems:   Gout, arthritis   Anemia   Hypertension   Bilateral lower extremity edema   Joint pain   GERD (gastroesophageal reflux disease)   Iron deficiency anemia due to chronic blood loss  I looked up the patient in [place] controlled substance reporting system and in the last 24 months the patient has had 2 prescriptions.   Discharge Condition: Stable  Diet recommendation: Gout Diet  Filed Weights   03/21/17 0453 03/22/17 0437 03/23/17 0551  Weight: 111.6 kg (246 lb 0.5 oz) 112.4 kg (247 lb 12.8 oz) 111.9 kg (246 lb 9.6 oz)    History of present illness:  Aziz Slape is a 52 y.o. male with medical history significant of gout, hypertension, GI bleeding, GERD, who presents with joint pain and swelling in bilateral knees and right wrist.   Pt state that he has gout flare up in the past several days. He has swelling and severe pain in bilateral knees (the left is worse than the right) and right wrist. He also has fever and chill. He states that these symptoms are typical for his gout flareup. Currently the pain is constant, 9 out of 10 in severity. Patient denies chest pain, shortness breath, cough, no nausea, vomiting, diarrhea or abdominal pain. No symptoms of UTI. Patient has chronic bilateral leg edema.      Hospital Course:  Admitted to the hospital with Sirs.  Started on antibiotics.  The escalated due to Sirs being linked to patient having polyarticular gout.  Patient given IV steroids.  Patient will be discharged on oral  medication.  Cultures negative.  Chest x-ray negative.  Procedures:  na (i.e. Studies not automatically included, echos, thoracentesis, etc; not x-rays)  Consultations:  na  Discharge Exam: Vitals:   03/23/17 0551 03/23/17 0800  BP: (!) 166/90 139/80  Pulse: 75 70  Resp: 19   Temp: 99 F (37.2 C) 98.4 F (36.9 C)  SpO2: 99% 100%    General: NCAT, NAD Cardiovascular: RRR, no MRG Respiratory: CTAB, nl wob  Discharge Instructions   Discharge Instructions    Ambulatory referral to Rheumatology   Complete by:  As directed    Call MD for:  difficulty breathing, headache or visual disturbances   Complete by:  As directed    Call MD for:  persistant dizziness or light-headedness   Complete by:  As directed    Call MD for:  persistant nausea and vomiting   Complete by:  As directed    Call MD for:  temperature >100.4   Complete by:  As directed    Diet - low sodium heart healthy   Complete by:  As directed    Discharge instructions   Complete by:  As directed    See your doctor next Friday to start steroid taper.   Increase activity slowly   Complete by:  As directed      Allergies as of 03/23/2017      Reactions   Ace Inhibitors Swelling   Facial swelling      Medication List    TAKE these medications  allopurinol 100 MG tablet Commonly known as:  ZYLOPRIM Take 100 mg by mouth daily.   cloNIDine 0.1 MG tablet Commonly known as:  CATAPRES Take 0.1 mg by mouth every 8 (eight) hours as needed. For systolic BP > or = 180   colchicine 0.6 MG tablet Take 1 tablet (0.6 mg total) by mouth daily for 21 days. Take 1.2mg  for a gout flare and then 0.6mg  an hour later prn continued symptoms.   ferrous sulfate 325 (65 FE) MG tablet Take 1 tablet (325 mg total) by mouth 2 (two) times daily with a meal for 21 days.   gabapentin 600 MG tablet Commonly known as:  NEURONTIN Take 600 mg by mouth 3 (three) times daily.   hydrALAZINE 50 MG tablet Commonly known as:   APRESOLINE Take 50 mg by mouth 3 (three) times daily.   methocarbamol 500 MG tablet Commonly known as:  ROBAXIN Take 1 tablet (500 mg total) by mouth every 6 (six) hours as needed for muscle spasms.   metoprolol succinate 50 MG 24 hr tablet Commonly known as:  TOPROL-XL Take 50 mg by mouth daily. Take with or immediately following a meal.   naproxen 375 MG tablet Commonly known as:  NAPROSYN Take 1 tablet (375 mg total) by mouth 2 (two) times daily as needed for mild pain or moderate pain.   omeprazole 20 MG capsule Commonly known as:  PRILOSEC Take 20 mg by mouth 2 (two) times daily before a meal.   oxyCODONE-acetaminophen 5-325 MG tablet Commonly known as:  PERCOCET/ROXICET Take 1 tablet by mouth every 4 (four) hours as needed for moderate pain.   potassium chloride SA 20 MEQ tablet Commonly known as:  K-DUR,KLOR-CON Take 20 mEq by mouth daily.   predniSONE 10 MG tablet Commonly known as:  DELTASONE Take 4 tablets (40 mg total) by mouth daily with breakfast for 8 days.   Vitamin D (Ergocalciferol) 50000 units Caps capsule Commonly known as:  DRISDOL Take 50,000 Units by mouth every 7 (seven) days.      Allergies  Allergen Reactions  . Ace Inhibitors Swelling    Facial swelling      The results of significant diagnostics from this hospitalization (including imaging, microbiology, ancillary and laboratory) are listed below for reference.    Significant Diagnostic Studies: Dg Chest 2 View  Result Date: 03/20/2017 CLINICAL DATA:  Heart murmur with fever EXAM: CHEST  2 VIEW COMPARISON:  01/26/2017 FINDINGS: No acute pulmonary infiltrate or effusion. Mild cardiomegaly with prominence of the central vasculature. No pneumothorax. IMPRESSION: 1. No acute pulmonary infiltrate 2. Mild cardiomegaly Electronically Signed   By: Jasmine Pang M.D.   On: 03/20/2017 20:02   Dg Hand 2 View Right  Result Date: 03/20/2017 CLINICAL DATA:  Pain right hand for while. No trauma.  History of gout. EXAM: RIGHT HAND - 2 VIEW COMPARISON:  January 25, 2017 right wrist film. FINDINGS: There is no evidence of fracture or dislocation. There is subtle erosion of the carpal bones. The previously noted hyperdensity along the dorsum of the wrist is not as well seen on today's exam. IMPRESSION: No acute abnormality. Subtle erosion of carpal bones which could be consistent with gout given history. Electronically Signed   By: Sherian Rein M.D.   On: 03/20/2017 15:57   Dg Knee Complete 4 Views Left  Result Date: 03/20/2017 CLINICAL DATA:  Left knee pain for while.  No trauma. EXAM: LEFT KNEE - COMPLETE 4+ VIEW COMPARISON:  January 25, 2017 FINDINGS: No  evidence of fracture, dislocation. Suprapatellar effusion is noted. Patient status post prior tunneled ACL repair. Degenerative joint changes with narrowed joint space and osteophyte formation and chondrocalcinosis are identified. IMPRESSION: Osteoarthritic changes of the left knee. Electronically Signed   By: Sherian ReinWei-Chen  Lin M.D.   On: 03/20/2017 15:58   Dg Knee Complete 4 Views Right  Result Date: 03/20/2017 CLINICAL DATA:  Bilateral knee pain for while. EXAM: RIGHT KNEE - COMPLETE 4+ VIEW COMPARISON:  None. FINDINGS: No evidence of fracture, dislocation, or joint effusion. There is chondrocalcinosis of the femoral tibial joint spaces. Soft tissues are unremarkable. 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 No acute abnormality. Electronically Signed   By: Sherian ReinWei-Chen  Lin M.D.   On: 03/20/2017 15:57    Microbiology: Recent Results (from the past 240 hour(s))  Blood Culture (routine x 2)     Status: None (Preliminary result)   Collection Time: 03/20/17  2:40 PM  Result Value Ref Range Status   Specimen Description BLOOD LEFT ANTECUBITAL  Final   Special Requests   Final    BOTTLES DRAWN AEROBIC AND ANAEROBIC Blood Culture adequate volume   Culture   Final    NO GROWTH 2 DAYS Performed at [facility],  [address] [place], [place] [address]    Report Status PENDING  Incomplete  Blood Culture (routine x 2)     Status: None (Preliminary result)   Collection Time: 03/20/17  3:00 PM  Result Value Ref Range Status   Specimen Description BLOOD LEFT HAND  Final   Special Requests   Final    BOTTLES DRAWN AEROBIC AND ANAEROBIC Blood Culture adequate volume   Culture   Final    NO GROWTH 2 DAYS Performed at [facility], [address] [place], [place] [address]    Report Status PENDING  Incomplete     Labs: Basic Metabolic Panel: Recent Labs  Lab 03/20/17 1437 03/21/17 0125 03/21/17 1001  NA 140 139 140  K 3.7 3.8 4.1  CL 101 105 104  CO2 24 24 25   GLUCOSE 128* 137* 155*  BUN 10 13 15   CREATININE 1.00 1.03 1.13  CALCIUM 9.7 8.9 9.0   Liver Function Tests: Recent Labs  Lab 03/20/17 1437  AST 14*  ALT 14*  ALKPHOS 92  BILITOT 0.8  PROT 7.2  ALBUMIN 3.3*   No results for input(s): LIPASE, AMYLASE in the last 168 hours. No results for input(s): AMMONIA in the last 168 hours. CBC: Recent Labs  Lab 03/20/17 1437 03/21/17 0125 03/21/17 1001 03/22/17 0437 03/23/17 0451  WBC 15.3* 10.3 10.7* 10.1 11.2*  NEUTROABS 12.7*  --  8.4*  --   --   HGB 8.6* 7.4* 7.6* 7.8* 8.3*  HCT 27.2* 24.1* 24.4* 25.8* 26.7*  MCV 84.5 84.9 84.1 84.6 84.2  PLT 314 236 269 270 291   Cardiac Enzymes: No results for input(s): CKTOTAL, CKMB, CKMBINDEX, TROPONINI in the last 168 hours. BNP: BNP (last 3 results) Recent Labs    03/20/17 2333  BNP 81.2    ProBNP (last 3 results) No results for input(s): PROBNP in the last 8760 hours.  CBG: No results for input(s): GLUCAP in the last 168 hours.     Signed:  Haydee SalterPhillip M Hobbs MD  FACP  [facility] 03/23/2017, 12:50 PM

## 2017-03-23 NOTE — Plan of Care (Signed)
  Progressing Clinical Measurements: Respiratory complications will improve 03/23/2017 0421 - Progressing by Horris Latinouvall, Airiel Oblinger G, RN Note No s/s of respiratory complications. Elimination: Will not experience complications related to urinary retention 03/23/2017 0421 - Progressing by Horris Latinouvall, Gabrian Hoque G, RN Note No complications noted; voiding well with adequate urine output. Pain Managment: General experience of comfort will improve 03/23/2017 0421 - Progressing by Horris Latinouvall, Rachid Parham G, RN Note Pain controlled with oral pain meds.

## 2017-03-23 NOTE — Care Management Note (Signed)
Case Management Note  Patient Details  Name: Alcide EvenerKurk Lewison MRN: [id number] Date of Birth: 01/13/1966  Subjective/Objective:  Pt presented for SIRS and gout. PTA from home alone and pt uses cane and RW.                    Action/Plan: CM did [facility] Pt declined HH Services at this time and will return home. No further needs from CM at this time.   Expected Discharge Date:  03/23/17               Expected Discharge Plan:  Home/Self Care  In-House Referral:  NA  Discharge planning Services  CM Consult  Post Acute Care Choice:  NA Choice offered to:  NA  DME Arranged:  N/A DME Agency:  NA  HH Arranged:  Patient Refused HH HH Agency:  NA  Status of Service:  Completed, signed off  If discussed at Long Length of Stay Meetings, dates discussed:    Additional Comments:  Gala LewandowskyGraves-Bigelow, Giann Obara Kaye, RN 03/23/2017, 1:50 PM

## 2017-03-25 LAB — CULTURE, BLOOD (ROUTINE X 2)
CULTURE: NO GROWTH
CULTURE: NO GROWTH
SPECIAL REQUESTS: ADEQUATE
Special Requests: ADEQUATE

## 2017-04-13 NOTE — Progress Notes (Signed)
Office Visit Note  Patient: Scott [name]             Date of Birth: 02/15/1965           MRN: [id number]             PCP: Marylou Flesheraylor, Jennifer Lavoie, NP Referring: Haydee SalterHobbs, Phillip M, MD Visit Date: 04/26/2017 Occupation: @GUAROCC @    Subjective:  Medication management.   History of Present Illness: Scott [name] is a 52 y.o. male seen in consultation per request of Dr. Melynda RippleHobbs for evaluation of gout.  According to patient he has had gout off and on for the last 10 years.  Most the gout flares has been in his feet.  He states the gout used to be about once a year and was treated with Indocin.  Since December 2018 he has had 3 flares.  He states in December he was started on allopurinol and colchicine.  He was taking allopurinol on days daily basis and colchicine as needed for flares.  He was also taking Naprosyn as needed for flares.  He was admitted to the hospital in February 2019 after having a severe flare involving his bilateral knee joints and right wrist joint.  At the time he was also having some fever.  He was given IV steroids and was hospitalized.  He was discharged on prednisone taper along with allopurinol and colchicine.  According to patient he is to stop the colchicine and he has been taking allopurinol.  Currently he is not having any symptoms.  Activities of Daily Living:  Patient reports morning stiffness for 10 minutes.   Patient Denies nocturnal pain.  Difficulty dressing/grooming: Denies Difficulty climbing stairs: Denies Difficulty getting out of chair: Denies Difficulty using hands for taps, buttons, cutlery, and/or writing: Denies   Review of Systems  Constitutional: Negative for fatigue and night sweats.  HENT: Negative for mouth sores, mouth dryness and nose dryness.   Eyes: Negative for redness and dryness.  Respiratory: Negative for shortness of breath and difficulty breathing.   Cardiovascular: Negative for chest pain, palpitations, hypertension, irregular  heartbeat and swelling in legs/feet.  Gastrointestinal: Negative for constipation and diarrhea.  Endocrine: Negative for increased urination.  Musculoskeletal: Positive for arthralgias, joint pain and morning stiffness. Negative for joint swelling, myalgias, muscle weakness, muscle tenderness and myalgias.  Skin: Negative for color change, rash, hair loss, nodules/bumps, skin tightness, ulcers and sensitivity to sunlight.  Allergic/Immunologic: Negative for susceptible to infections.  Neurological: Negative for dizziness, fainting, memory loss, night sweats and weakness ( ).  Hematological: Negative for swollen glands.  Psychiatric/Behavioral: Negative for depressed mood and sleep disturbance. The patient is not nervous/anxious.     PMFS History:  Patient Active Problem List   Diagnosis Date Noted  . Iron deficiency anemia due to chronic blood loss 03/22/2017  . Joint pain 03/20/2017  . GERD (gastroesophageal reflux disease) 03/20/2017  . SIRS (systemic inflammatory response syndrome) (HCC) 03/20/2017  . Elevated alkaline phosphatase level 02/21/2017  . Bilateral lower extremity edema 02/17/2017  . Left lower lobe pneumonia (HCC) 02/01/2017  . Hypokalemia 01/24/2017  . Gout, arthritis 01/24/2017  . Gastritis 01/24/2017  . h/o recent GIB (gastrointestinal bleeding) 01/24/2017  . Hypomagnesemia 01/24/2017  . Anemia   . Hypertension     Past Medical History:  Diagnosis Date  . Gout   . Hypertension     Family History  Problem Relation Age of Onset  . Cancer Paternal Grandmother        ovarian  .  Hypertension Mother   . Arthritis Mother   . Healthy Son   . Healthy Daughter    Past Surgical History:  Procedure Laterality Date  . APPENDECTOMY     open  . KNEE ARTHROSCOPY Left    partial revision   Social History   Social History Narrative  . Not on file     Objective: Vital Signs: BP (!) 148/89 (BP Location: Left Arm, Patient Position: Sitting, Cuff Size: Normal)    Pulse 79   Resp 18   Ht 5\' 11"  (1.803 m)   Wt 230 lb (104.3 kg)   BMI 32.08 kg/m    Physical Exam  Constitutional: He is oriented to person, place, and time. He appears well-developed and well-nourished.  HENT:  Head: Normocephalic and atraumatic.  Eyes: Pupils are equal, round, and reactive to light. Conjunctivae and EOM are normal.  Neck: Normal range of motion. Neck supple.  Cardiovascular: Normal rate, regular rhythm and normal heart sounds.  Pulmonary/Chest: Effort normal and breath sounds normal.  Abdominal: Soft. Bowel sounds are normal.  Neurological: He is alert and oriented to person, place, and time.  Skin: Skin is warm and dry. Capillary refill takes less than 2 seconds.  Psychiatric: He has a normal mood and affect. His behavior is normal.  Nursing note and vitals reviewed.    Musculoskeletal Exam: C-spine thoracic lumbar spine fairly good range of motion.  Shoulder joints elbow joints were in good range of motion.  He has some nodules over his bilateral elbows.  He had synovitis in his right wrist joint and some of the MCP joints as described below.  He also has warmth swelling and large effusion in his left knee joint.  CDAI Exam: CDAI Homunculus Exam:   Tenderness:  RUE: wrist Right hand: 2nd MCP, 3rd MCP and 5th MCP LLE: tibiofemoral  Swelling:  RUE: wrist Right hand: 2nd MCP, 3rd MCP and 5th MCP LLE: tibiofemoral  Joint Counts:  CDAI Tender Joint count: 5 CDAI Swollen Joint count: 5  Global Assessments:  Patient Global Assessment: 5 Provider Global Assessment: 6  CDAI Calculated Score: 21    Investigation: No additional findings. CBC Latest Ref Rng & Units 03/23/2017 03/22/2017 03/21/2017  WBC 4.0 - 10.5 K/uL 11.2(H) 10.1 10.7(H)  Hemoglobin 13.0 - 17.0 g/dL 8.3(L) 7.8(L) 7.6(L)  Hematocrit 39.0 - 52.0 % 26.7(L) 25.8(L) 24.4(L)  Platelets 150 - 400 K/uL 291 270 269   CMP Latest Ref Rng & Units 03/21/2017 03/21/2017 03/20/2017  Glucose 65 - 99  mg/dL 161(W) 960(A) 540(J)  BUN 6 - 20 mg/dL 15 13 10   Creatinine 0.61 - 1.24 mg/dL 8.11 9.14 7.82  Sodium 135 - 145 mmol/L 140 139 140  Potassium 3.5 - 5.1 mmol/L 4.1 3.8 3.7  Chloride 101 - 111 mmol/L 104 105 101  CO2 22 - 32 mmol/L 25 24 24   Calcium 8.9 - 10.3 mg/dL 9.0 8.9 9.7  Total Protein 6.5 - 8.1 g/dL - - 7.2  Total Bilirubin 0.3 - 1.2 mg/dL - - 0.8  Alkaline Phos 38 - 126 U/L - - 92  AST 15 - 41 U/L - - 14(L)  ALT 17 - 63 U/L - - 14(L)   December 2018 uric acid 8.7, February 2019 uric acid 6.8 Imaging: No results found.  Speciality Comments: No specialty comments available.    Procedures:  No procedures performed Allergies: Ace inhibitors   Assessment / Plan:     Visit Diagnoses: Idiopathic chronic gout without tophus, unspecified site -crystal  proven allopurinol 100 mg daily Colchicine 0.6 mg daily 03/20/17 uric acid 6.8.  Patient states she has had frequent flares of gout since December.  He had active swelling in his multiple joints today.  He has synovitis in his right hand MCPs.  He had large effusion in his left knee joint.  In my opinion his gout is not well controlled.  We had detailed discussion regarding gout and its management.  Ideally his uric acid should be around 4 so he would not have any further flares.  We discussed increasing his allopurinol gradually to 200 mg and then 300 mg.  While he is increasing his allopurinol he has to take colchicine as a prophylactic therapy.  He believes that colchicine causes gout flares.  And he does not want to take colchicine.  I also reviewed his records for recent hospitalization.  He has been off prednisone for only 2 weeks.  At this point he does not want to follow any recommendations.  He states he would only follow recommendations of his PCP.  Hyperuricemia: He has history of hyperuricemia which is documented in his labs which were reviewed at length today.  Chondrocalcinosis: Based on his x-rays he also has  chondrocalcinosis.  Which can also cause frequent flares.  Even if his gout is controlled he will need colchicine to prevent pseudogout flares.  Pain in both hands: He has significant synovitis over his MCPs and his wrist joints.  It raises the concern if he has underlying rheumatoid arthritis or other and inflammatory arthritis.  Although states symptoms could be coming from gout or pseudogout.  I would recommend obtaining rheumatoid factor and anti-CCP antibody to be sure.  Patient declined to have any blood work today.  Primary osteoarthritis of both knees: Based on the radiographic reports he has osteoarthritis involving bilateral knee joints.  Effusion of bursa of left knee: He had significant difficulty walking and he had rales or effusion in his left knee joint.  Elevated alkaline phosphatase level  Bilateral lower extremity edema  Essential hypertension: His blood pressure is elevated today.  Have advised him to follow up with his PCP.  Iron deficiency anemia due to chronic blood loss: Patient states that he will be following up with PCP regarding evaluation of blood loss and anemia.  History of gastroesophageal reflux (GERD)  History of gastritis    Orders: No orders of the defined types were placed in this encounter.  No orders of the defined types were placed in this encounter.   Face-to-face time spent with patient was 45 minutes.  Greater than 50% of time was spent in counseling and coordination of care.  Follow-Up Instructions: Return if symptoms worsen or fail to improve, for Gout, pseudogout, Osteoarthritis.  Patient does not want to come back to our office and wants to follow-up with PCP only.  He states this referral was not made after discussing with him.   Pollyann Savoy, MD  Note - This record has been created using Animal nutritionist.  Chart creation errors have been sought, but may not always  have been located. Such creation errors do not reflect on  the  standard of medical care.

## 2017-04-26 ENCOUNTER — Ambulatory Visit: Payer: 59 | Admitting: Rheumatology

## 2017-04-26 ENCOUNTER — Encounter: Payer: Self-pay | Admitting: Rheumatology

## 2017-04-26 VITALS — BP 148/89 | HR 79 | Resp 18 | Ht 71.0 in | Wt 230.0 lb

## 2017-04-26 DIAGNOSIS — R600 Localized edema: Secondary | ICD-10-CM | POA: Diagnosis not present

## 2017-04-26 DIAGNOSIS — M1A00X0 Idiopathic chronic gout, unspecified site, without tophus (tophi): Secondary | ICD-10-CM | POA: Diagnosis not present

## 2017-04-26 DIAGNOSIS — R748 Abnormal levels of other serum enzymes: Secondary | ICD-10-CM | POA: Diagnosis not present

## 2017-04-26 DIAGNOSIS — D500 Iron deficiency anemia secondary to blood loss (chronic): Secondary | ICD-10-CM

## 2017-04-26 DIAGNOSIS — I10 Essential (primary) hypertension: Secondary | ICD-10-CM | POA: Diagnosis not present

## 2017-04-26 DIAGNOSIS — Z8719 Personal history of other diseases of the digestive system: Secondary | ICD-10-CM

## 2017-04-26 DIAGNOSIS — E790 Hyperuricemia without signs of inflammatory arthritis and tophaceous disease: Secondary | ICD-10-CM | POA: Diagnosis not present

## 2017-04-26 DIAGNOSIS — M1120 Other chondrocalcinosis, unspecified site: Secondary | ICD-10-CM

## 2017-04-26 DIAGNOSIS — M170 Bilateral primary osteoarthritis of knee: Secondary | ICD-10-CM

## 2017-04-26 DIAGNOSIS — M79641 Pain in right hand: Secondary | ICD-10-CM | POA: Diagnosis not present

## 2017-04-26 DIAGNOSIS — M79642 Pain in left hand: Secondary | ICD-10-CM

## 2017-04-26 DIAGNOSIS — M25462 Effusion, left knee: Secondary | ICD-10-CM

## 2019-07-01 DEATH — deceased

## 2020-02-18 IMAGING — DX DG KNEE COMPLETE 4+V*R*
4 series · 4 of 4 positions shown · non-contrast
Comparison: None.

CLINICAL DATA: Bilateral knee pain for while.

EXAM:
RIGHT KNEE - COMPLETE 4+ VIEW

[x knee ap right]
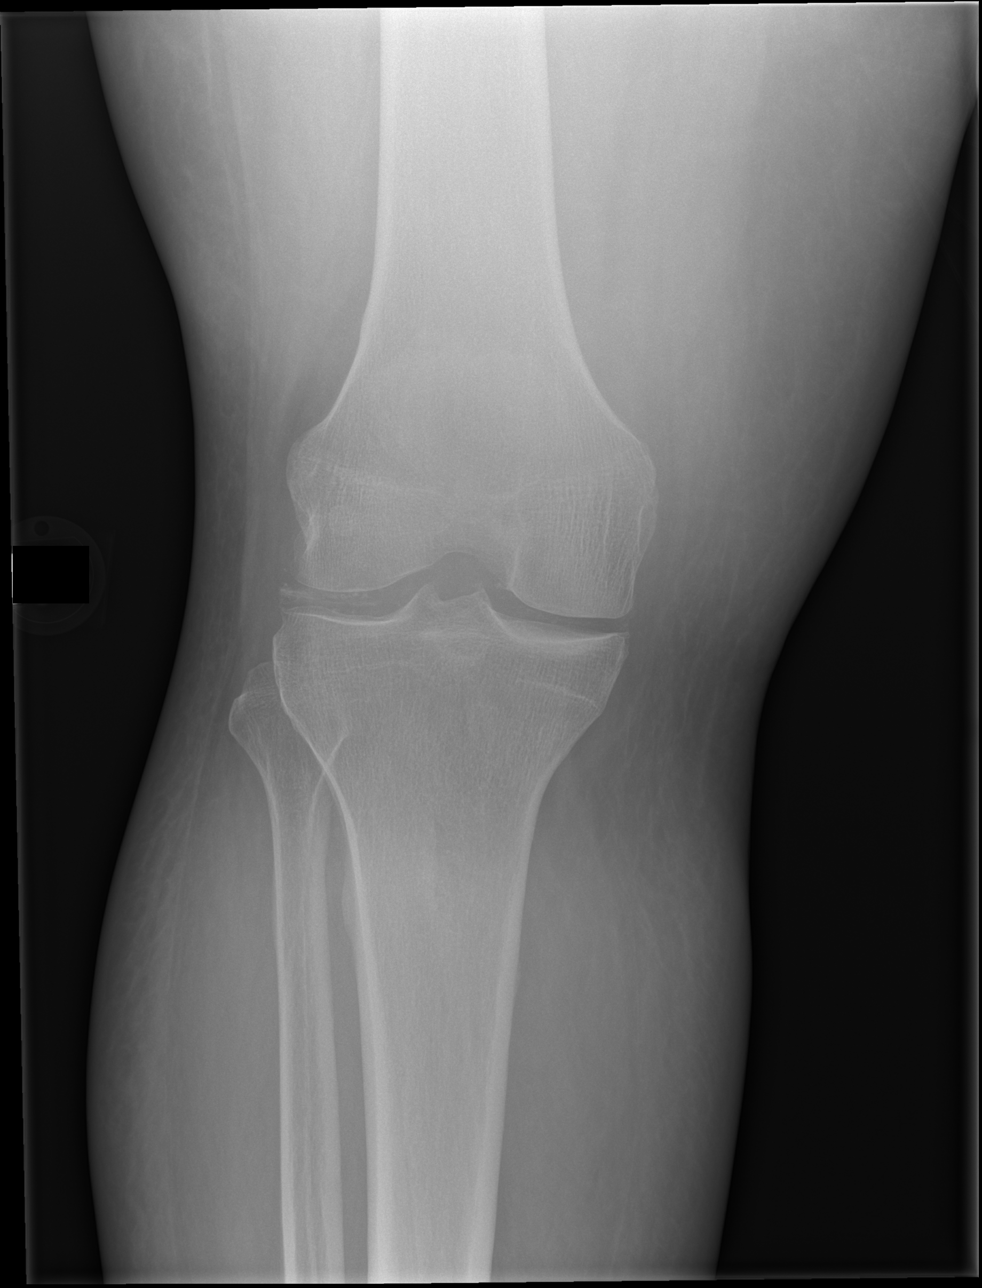

[x knee obl right (1 of 2)]
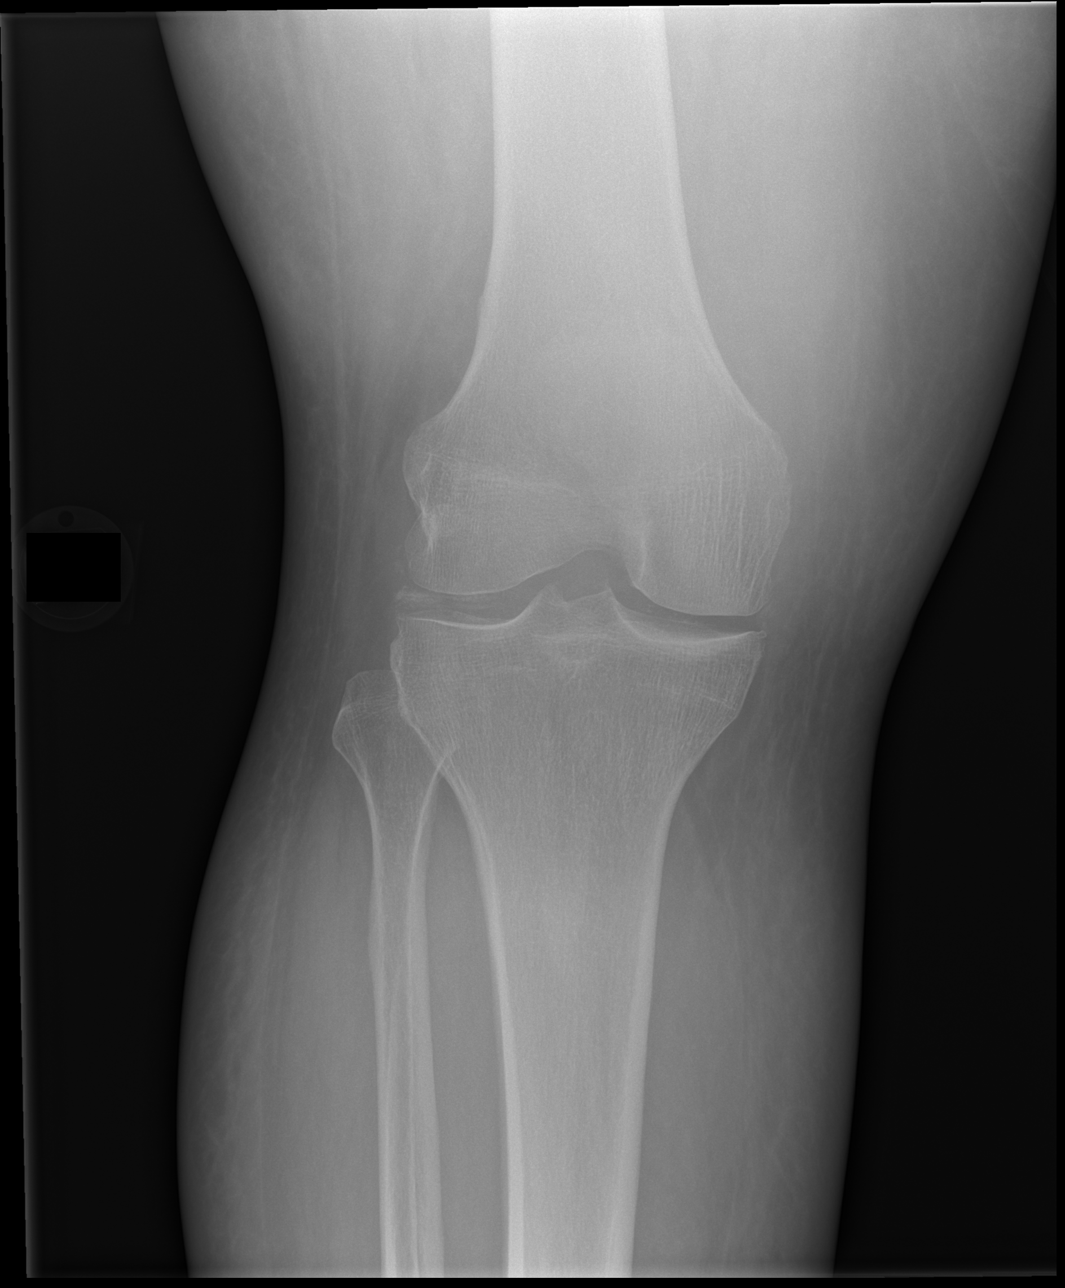

[x knee obl right (2 of 2)]
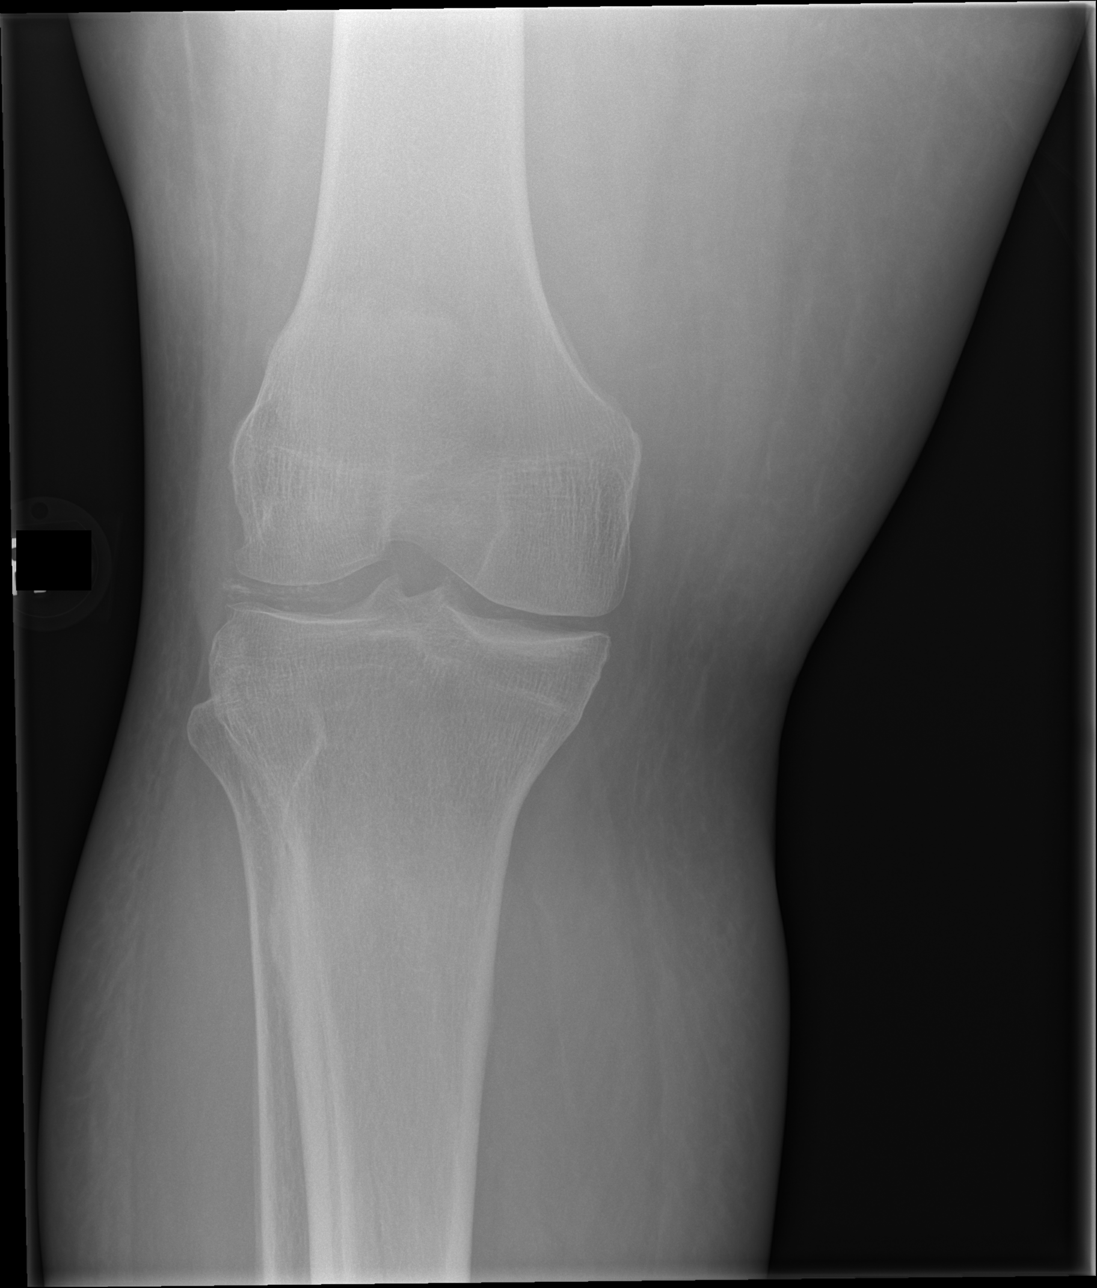

[x knee lat right]
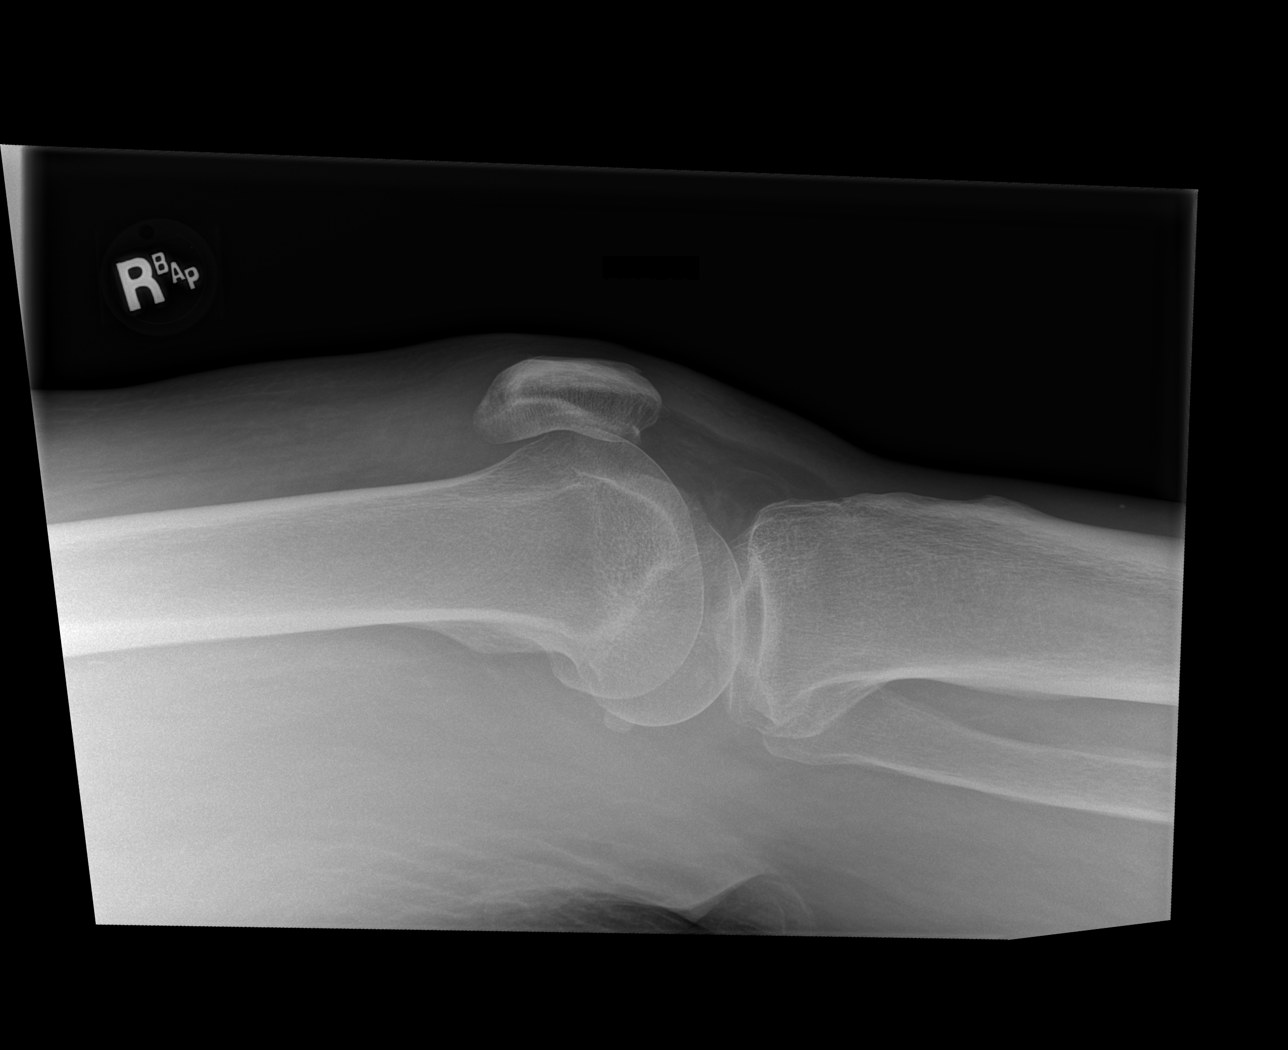

[4 of 4 positions shown; findings below may reference images not displayed]

FINDINGS: No evidence of fracture, dislocation, or joint effusion. There is
chondrocalcinosis of the femoral tibial joint spaces. Soft tissues
are unremarkable.
IMPRESSION: Chondrocalcinosis of the femoral tibial joint spaces. No acute
abnormality.

## 2020-02-18 IMAGING — DX DG HAND 2V*R*
2 series · 2 of 2 positions shown · non-contrast
Comparison: January 25, 2017 right wrist film.

CLINICAL DATA: Pain right hand for while. No trauma. History of
gout.

EXAM:
RIGHT HAND - 2 VIEW

[x hand pa right]
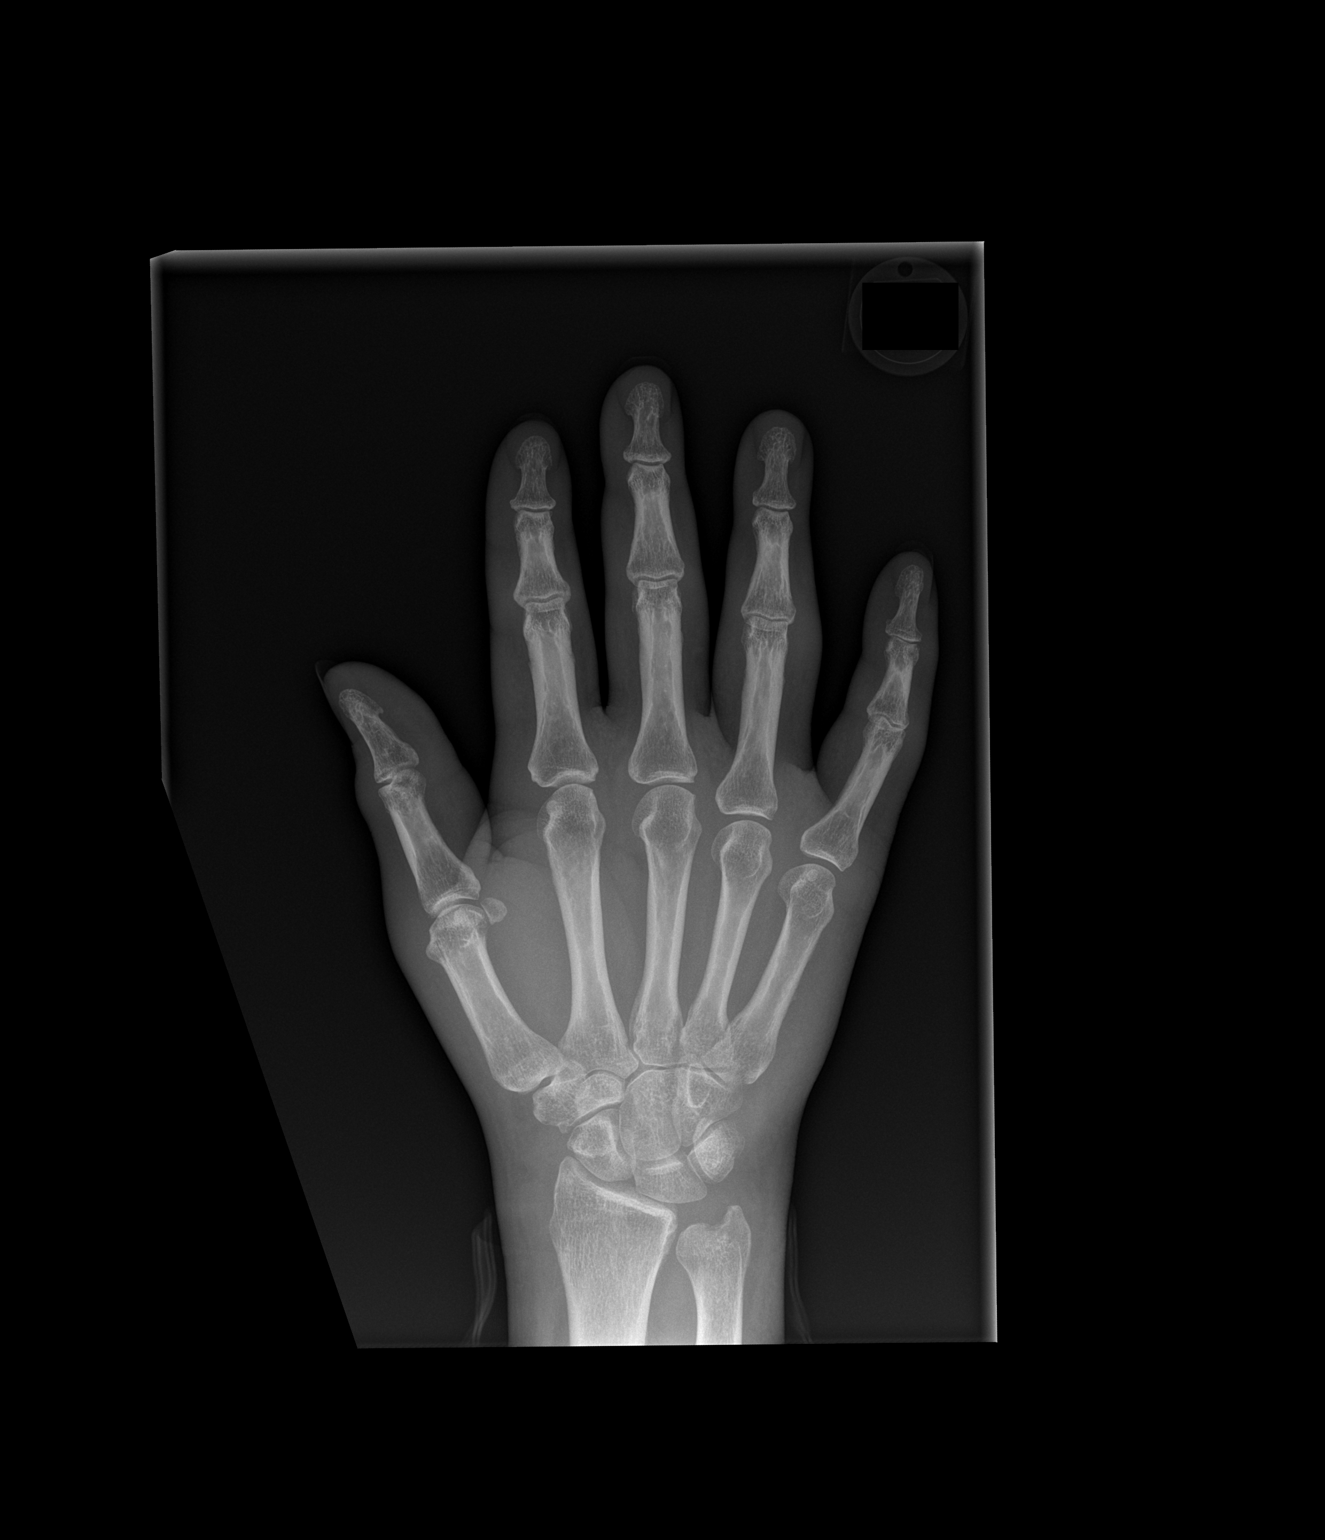

[x hand lat right]
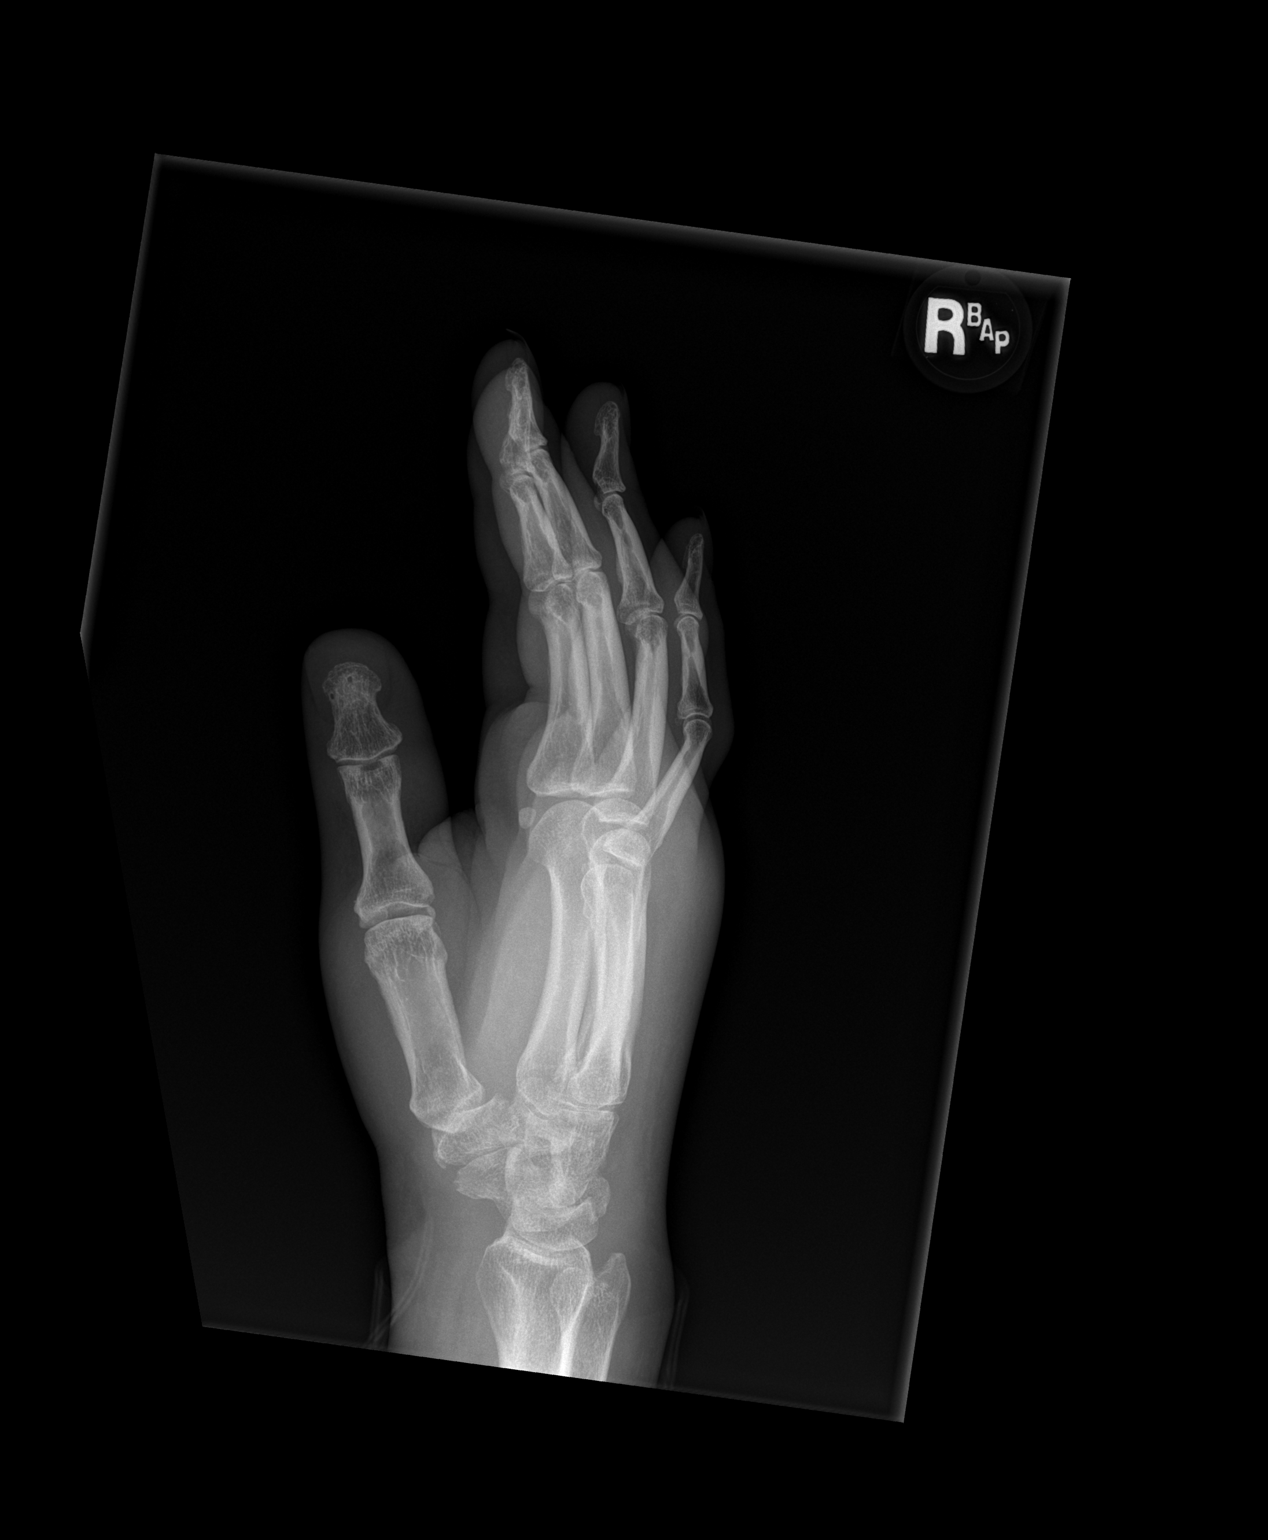

[2 of 2 positions shown; findings below may reference images not displayed]

FINDINGS: There is no evidence of fracture or dislocation. There is subtle
erosion of the carpal bones. The previously noted hyperdensity along
the dorsum of the wrist is not as well seen on today's exam.
IMPRESSION: No acute abnormality. Subtle erosion of carpal bones which could be
consistent with gout given history.
